# Patient Record
Sex: Female | Born: 1955 | Race: White | Hispanic: No | Marital: Married | Smoking: Never smoker
Health system: Southern US, Community
[De-identification: ages and names within clinical notes are randomized; demographics above are authoritative.]

## PROBLEM LIST (undated history)

## (undated) DIAGNOSIS — M7070 Other bursitis of hip, unspecified hip: Secondary | ICD-10-CM

## (undated) DIAGNOSIS — B019 Varicella without complication: Secondary | ICD-10-CM

## (undated) DIAGNOSIS — N390 Urinary tract infection, site not specified: Secondary | ICD-10-CM

## (undated) DIAGNOSIS — M1990 Unspecified osteoarthritis, unspecified site: Secondary | ICD-10-CM

## (undated) DIAGNOSIS — I471 Supraventricular tachycardia, unspecified: Secondary | ICD-10-CM

## (undated) DIAGNOSIS — G43909 Migraine, unspecified, not intractable, without status migrainosus: Secondary | ICD-10-CM

## (undated) DIAGNOSIS — F419 Anxiety disorder, unspecified: Secondary | ICD-10-CM

## (undated) HISTORY — DX: Supraventricular tachycardia, unspecified: I47.10

## (undated) HISTORY — DX: Migraine, unspecified, not intractable, without status migrainosus: G43.909

## (undated) HISTORY — DX: Urinary tract infection, site not specified: N39.0

## (undated) HISTORY — DX: Anxiety disorder, unspecified: F41.9

## (undated) HISTORY — DX: Other bursitis of hip, unspecified hip: M70.70

## (undated) HISTORY — PX: JOINT REPLACEMENT: SHX530

## (undated) HISTORY — DX: Varicella without complication: B01.9

## (undated) HISTORY — PX: TUBAL LIGATION: SHX77

## (undated) HISTORY — DX: Supraventricular tachycardia: I47.1

## (undated) HISTORY — DX: Unspecified osteoarthritis, unspecified site: M19.90

## (undated) HISTORY — PX: ABDOMINAL HYSTERECTOMY: SHX81

## (undated) HISTORY — PX: BREAST SURGERY: SHX581

---

## 1998-09-28 ENCOUNTER — Inpatient Hospital Stay (HOSPITAL_COMMUNITY): Admission: RE | Admit: 1998-09-28 | Discharge: 1998-10-01 | Payer: Self-pay | Admitting: Obstetrics and Gynecology

## 1999-05-04 ENCOUNTER — Emergency Department (HOSPITAL_COMMUNITY): Admission: EM | Admit: 1999-05-04 | Discharge: 1999-05-04 | Payer: Self-pay | Admitting: Emergency Medicine

## 1999-07-12 ENCOUNTER — Encounter: Payer: Self-pay | Admitting: Internal Medicine

## 1999-07-12 ENCOUNTER — Emergency Department (HOSPITAL_COMMUNITY): Admission: EM | Admit: 1999-07-12 | Discharge: 1999-07-12 | Payer: Self-pay | Admitting: Internal Medicine

## 2002-09-03 ENCOUNTER — Encounter: Admission: RE | Admit: 2002-09-03 | Discharge: 2002-09-03 | Payer: Self-pay | Admitting: *Deleted

## 2002-09-03 ENCOUNTER — Encounter: Payer: Self-pay | Admitting: Obstetrics and Gynecology

## 2002-09-29 ENCOUNTER — Encounter: Payer: Self-pay | Admitting: Obstetrics and Gynecology

## 2002-09-29 ENCOUNTER — Encounter: Admission: RE | Admit: 2002-09-29 | Discharge: 2002-09-29 | Payer: Self-pay | Admitting: Obstetrics and Gynecology

## 2004-01-04 ENCOUNTER — Emergency Department (HOSPITAL_COMMUNITY): Admission: EM | Admit: 2004-01-04 | Discharge: 2004-01-05 | Payer: Self-pay | Admitting: Emergency Medicine

## 2004-09-05 ENCOUNTER — Ambulatory Visit: Payer: Self-pay | Admitting: Internal Medicine

## 2004-09-06 ENCOUNTER — Ambulatory Visit: Payer: Self-pay | Admitting: Internal Medicine

## 2005-08-28 ENCOUNTER — Ambulatory Visit: Payer: Self-pay | Admitting: Internal Medicine

## 2006-08-27 ENCOUNTER — Ambulatory Visit: Payer: Self-pay | Admitting: Internal Medicine

## 2007-06-10 ENCOUNTER — Telehealth: Payer: Self-pay | Admitting: Internal Medicine

## 2007-07-01 ENCOUNTER — Telehealth (INDEPENDENT_AMBULATORY_CARE_PROVIDER_SITE_OTHER): Payer: Self-pay | Admitting: *Deleted

## 2007-10-23 ENCOUNTER — Telehealth (INDEPENDENT_AMBULATORY_CARE_PROVIDER_SITE_OTHER): Payer: Self-pay | Admitting: *Deleted

## 2007-10-28 ENCOUNTER — Telehealth (INDEPENDENT_AMBULATORY_CARE_PROVIDER_SITE_OTHER): Payer: Self-pay | Admitting: *Deleted

## 2008-03-04 ENCOUNTER — Telehealth (INDEPENDENT_AMBULATORY_CARE_PROVIDER_SITE_OTHER): Payer: Self-pay | Admitting: *Deleted

## 2008-03-18 ENCOUNTER — Telehealth (INDEPENDENT_AMBULATORY_CARE_PROVIDER_SITE_OTHER): Payer: Self-pay | Admitting: *Deleted

## 2008-03-23 ENCOUNTER — Ambulatory Visit: Payer: Self-pay | Admitting: Family Medicine

## 2008-03-23 ENCOUNTER — Encounter (INDEPENDENT_AMBULATORY_CARE_PROVIDER_SITE_OTHER): Payer: Self-pay | Admitting: *Deleted

## 2008-03-23 ENCOUNTER — Encounter: Payer: Self-pay | Admitting: Internal Medicine

## 2008-03-23 DIAGNOSIS — R519 Headache, unspecified: Secondary | ICD-10-CM | POA: Insufficient documentation

## 2008-03-23 DIAGNOSIS — R51 Headache: Secondary | ICD-10-CM | POA: Insufficient documentation

## 2008-03-23 DIAGNOSIS — N959 Unspecified menopausal and perimenopausal disorder: Secondary | ICD-10-CM | POA: Insufficient documentation

## 2008-03-23 DIAGNOSIS — Z8679 Personal history of other diseases of the circulatory system: Secondary | ICD-10-CM | POA: Insufficient documentation

## 2008-03-24 ENCOUNTER — Encounter (INDEPENDENT_AMBULATORY_CARE_PROVIDER_SITE_OTHER): Payer: Self-pay | Admitting: *Deleted

## 2008-10-30 ENCOUNTER — Telehealth: Payer: Self-pay | Admitting: Family Medicine

## 2009-04-05 ENCOUNTER — Telehealth (INDEPENDENT_AMBULATORY_CARE_PROVIDER_SITE_OTHER): Payer: Self-pay | Admitting: *Deleted

## 2009-04-14 ENCOUNTER — Ambulatory Visit: Payer: Self-pay | Admitting: Internal Medicine

## 2010-10-17 ENCOUNTER — Other Ambulatory Visit: Payer: Self-pay | Admitting: Internal Medicine

## 2010-10-23 ENCOUNTER — Other Ambulatory Visit: Payer: Self-pay | Admitting: Internal Medicine

## 2010-10-23 NOTE — Telephone Encounter (Signed)
Dr.Hopper please advise, last OV 04/14/2009

## 2010-10-23 NOTE — Telephone Encounter (Signed)
Unfortunately she has not been seen for 18 months. She would need an acute visit to allow refill of this medicines as required by the standard of care of the [facility]  medical board.

## 2010-10-24 NOTE — Telephone Encounter (Signed)
Called patient to inform her OV due. No answer and no answering service to leave message on VM. I will send message to pharmacy informing them patient needs to schedule OV

## 2010-10-30 ENCOUNTER — Encounter: Payer: Self-pay | Admitting: Internal Medicine

## 2010-10-30 DIAGNOSIS — Z0289 Encounter for other administrative examinations: Secondary | ICD-10-CM

## 2010-10-31 NOTE — Progress Notes (Signed)
  Subjective:    Patient ID: Katherine Sexton, female    DOB: 08-12-1955, 55 y.o.   MRN: [id number]             No Show for CPX HPI    Review of Systems     Objective:   Physical Exam        Assessment & Plan:      This encounter was created in error - please disregard.

## 2011-10-07 ENCOUNTER — Ambulatory Visit (INDEPENDENT_AMBULATORY_CARE_PROVIDER_SITE_OTHER): Payer: BC Managed Care – PPO | Admitting: Family Medicine

## 2011-10-07 VITALS — BP 119/88 | HR 98 | Temp 97.8°F | Resp 16 | Ht 69.0 in | Wt 154.0 lb

## 2011-10-07 DIAGNOSIS — J029 Acute pharyngitis, unspecified: Secondary | ICD-10-CM

## 2011-10-07 DIAGNOSIS — J069 Acute upper respiratory infection, unspecified: Secondary | ICD-10-CM

## 2011-10-07 LAB — POCT RAPID STREP A (OFFICE): Rapid Strep A Screen: NEGATIVE

## 2011-10-07 MED ORDER — AZITHROMYCIN 250 MG PO TABS: ORAL_TABLET | ORAL | Status: AC

## 2011-10-07 MED ORDER — HYDROCODONE-HOMATROPINE 5-1.5 MG/5ML PO SYRP: 5.0000 mL | ORAL_SOLUTION | Freq: Three times a day (TID) | ORAL | Status: AC | PRN

## 2011-10-07 NOTE — Progress Notes (Signed)
This 56 year old woman with 5 days of progressive cough, sore throat and nasal congestion. The sore throat became much more intense last night. The cough is productive of light brown sputum. She's not had a fever.  Objective: Throat is bright red the posterior pharynx, TMs are normal, chest is clear but she has a very congested cough. Neck is supple with mild anterior cervical adenopathy. Skin is warm and dry. Results for orders placed in visit on 10/07/11  POCT RAPID STREP A (OFFICE)      Component Value Range   Rapid Strep A Screen Negative  Negative    Assessment: Progressive URI  Plan: Z-Pak and Hydromet

## 2012-01-14 HISTORY — PX: BREAST ENHANCEMENT SURGERY: SHX7

## 2012-02-27 ENCOUNTER — Ambulatory Visit (INDEPENDENT_AMBULATORY_CARE_PROVIDER_SITE_OTHER): Payer: BC Managed Care – PPO | Admitting: Family Medicine

## 2012-02-27 VITALS — BP 118/84 | HR 84 | Temp 97.4°F | Resp 16 | Ht 67.75 in | Wt 150.0 lb

## 2012-02-27 DIAGNOSIS — N3090 Cystitis, unspecified without hematuria: Secondary | ICD-10-CM

## 2012-02-27 DIAGNOSIS — R319 Hematuria, unspecified: Secondary | ICD-10-CM

## 2012-02-27 DIAGNOSIS — N3091 Cystitis, unspecified with hematuria: Secondary | ICD-10-CM

## 2012-02-27 LAB — POCT UA - MICROSCOPIC ONLY
Casts, Ur, LPF, POC: NEGATIVE
Mucus, UA: NEGATIVE

## 2012-02-27 LAB — POCT URINALYSIS DIPSTICK
Glucose, UA: NEGATIVE
Spec Grav, UA: >=1.03
Urobilinogen, UA: 0.2

## 2012-02-27 MED ORDER — PHENAZOPYRIDINE HCL 100 MG PO TABS: 100.0000 mg | ORAL_TABLET | Freq: Three times a day (TID) | ORAL | Status: AC | PRN

## 2012-02-27 MED ORDER — CIPROFLOXACIN HCL 250 MG PO TABS: 250.0000 mg | ORAL_TABLET | Freq: Two times a day (BID) | ORAL | Status: AC

## 2012-02-27 NOTE — Progress Notes (Signed)
Subjective:    Patient ID: Katherine Sexton, female    DOB: 01-20-1956, 56 y.o.   MRN: [id number]  HPI Katherine Sexton is a 56 y.o. female Started with urgency, dysuria yesterday - hematuria noted this morning.   Last uti - 10 years ago.   No back pain/N/V.  Fatigued, but has been driving back and forth to [place] - mom had stroke recently.  Tx: none.    Review of Systems  Constitutional: Negative for fever.  Genitourinary: Positive for dysuria, urgency, frequency and hematuria.  Musculoskeletal: Negative for back pain.   No f/c, n/v or back pain.     Objective:   Physical Exam  Constitutional: She is oriented to person, place, and time. She appears well-developed and well-nourished.  HENT:  Head: Normocephalic and atraumatic.  Pulmonary/Chest: Effort normal.  Abdominal: Soft. Normal appearance. She exhibits no distension. There is no tenderness. There is no rebound, no guarding and no CVA tenderness.  Neurological: She is alert and oriented to person, place, and time.  Skin: Skin is warm and dry.  Psychiatric: She has a normal mood and affect. Her behavior is normal.      Results for orders placed in visit on 02/27/12  POCT UA - MICROSCOPIC ONLY      Component Value Range   WBC, Ur, HPF, POC 10-20     RBC, urine, microscopic tntc     Bacteria, U Microscopic 3+     Mucus, UA neg     Epithelial cells, urine per micros 0-2     Crystals, Ur, HPF, POC neg     Casts, Ur, LPF, POC neg     Yeast, UA neg    POCT URINALYSIS DIPSTICK      Component Value Range   Color, UA brown     Clarity, UA cloudy     Glucose, UA neg     Bilirubin, UA small     Ketones, UA trace     Spec Grav, UA >=1.030     Blood, UA large     pH, UA 6.0     Protein, UA 100 mg/dL     Urobilinogen, UA 0.2     Nitrite, UA positive     Leukocytes, UA small (1+)         Assessment & Plan:  Katherine Sexton is a 56 y.o. female 1. Hematuria  POCT UA - Microscopic Only, POCT urinalysis dipstick  2.  Hemorrhagic cystitis  ciprofloxacin (CIPRO) 250 MG tablet, phenazopyridine (PYRIDIUM) 100 MG tablet, Urine culture   Push fluids - 5 day course of cipro for hemorrhagic cystitis.  rtc precautions.   Patient Instructions  Your should receive a call or letter about your lab results within the next week to 10 days.  Return to the clinic or go to the nearest emergency room if any of your symptoms worsen or new symptoms occur. If the blood in the urine is not improving in the next few days as the infection resolves - return to clinic or your primary care provider to have this rechecked.    Urinary Tract Infection Infections of the urinary tract can start in several places. A bladder infection (cystitis), a kidney infection (pyelonephritis), and a prostate infection (prostatitis) are different types of urinary tract infections (UTIs). They usually get better if treated with medicines (antibiotics) that kill germs. Take all the medicine until it is gone. You or your child may feel better in a few days, but TAKE ALL  MEDICINE or the infection may not respond and may become more difficult to treat. HOME CARE INSTRUCTIONS   Drink enough water and fluids to keep the urine clear or pale yellow. Cranberry juice is especially recommended, in addition to large amounts of water.   Avoid caffeine, tea, and carbonated beverages. They tend to irritate the bladder.   Alcohol may irritate the prostate.   Only take over-the-counter or prescription medicines for pain, discomfort, or fever as directed by your caregiver.  To prevent further infections:  Empty the bladder often. Avoid holding urine for long periods of time.   After a bowel movement, women should cleanse from front to back. Use each tissue only once.   Empty the bladder before and after sexual intercourse.  FINDING OUT THE RESULTS OF YOUR TEST Not all test results are available during your visit. If your or your child's test results are not back  during the visit, make an appointment with your caregiver to find out the results. Do not assume everything is normal if you have not heard from your caregiver or the medical facility. It is important for you to follow up on all test results. SEEK MEDICAL CARE IF:   There is back pain.   Your baby is older than 3 months with a rectal temperature of 100.5 F (38.1 C) or higher for more than 1 day.   Your or your child's problems (symptoms) are no better in 3 days. Return sooner if you or your child is getting worse.  SEEK IMMEDIATE MEDICAL CARE IF:   There is severe back pain or lower abdominal pain.   You or your child develops chills.   You have a fever.   Your baby is older than 3 months with a rectal temperature of 102 F (38.9 C) or higher.   Your baby is 41 months old or younger with a rectal temperature of 100.4 F (38 C) or higher.   There is nausea or vomiting.   There is continued burning or discomfort with urination.  MAKE SURE YOU:   Understand these instructions.   Will watch your condition.   Will get help right away if you are not doing well or get worse.  Document Released: 04/11/2005 Document Revised: 06/21/2011 Document Reviewed: 11/14/2006 [facility] Patient Information 2012 [place], [place].

## 2012-02-27 NOTE — Patient Instructions (Signed)
Your should receive a call or letter about your lab results within the next week to 10 days.  Return to the clinic or go to the nearest emergency room if any of your symptoms worsen or new symptoms occur. If the blood in the urine is not improving in the next few days as the infection resolves - return to clinic or your primary care provider to have this rechecked.    Urinary Tract Infection Infections of the urinary tract can start in several places. A bladder infection (cystitis), a kidney infection (pyelonephritis), and a prostate infection (prostatitis) are different types of urinary tract infections (UTIs). They usually get better if treated with medicines (antibiotics) that kill germs. Take all the medicine until it is gone. You or your child may feel better in a few days, but TAKE ALL MEDICINE or the infection may not respond and may become more difficult to treat. HOME CARE INSTRUCTIONS   Drink enough water and fluids to keep the urine clear or pale yellow. Cranberry juice is especially recommended, in addition to large amounts of water.   Avoid caffeine, tea, and carbonated beverages. They tend to irritate the bladder.   Alcohol may irritate the prostate.   Only take over-the-counter or prescription medicines for pain, discomfort, or fever as directed by your caregiver.  To prevent further infections:  Empty the bladder often. Avoid holding urine for long periods of time.   After a bowel movement, women should cleanse from front to back. Use each tissue only once.   Empty the bladder before and after sexual intercourse.  FINDING OUT THE RESULTS OF YOUR TEST Not all test results are available during your visit. If your or your child's test results are not back during the visit, make an appointment with your caregiver to find out the results. Do not assume everything is normal if you have not heard from your caregiver or the medical facility. It is important for you to follow up on all  test results. SEEK MEDICAL CARE IF:   There is back pain.   Your baby is older than 3 months with a rectal temperature of 100.5 F (38.1 C) or higher for more than 1 day.   Your or your child's problems (symptoms) are no better in 3 days. Return sooner if you or your child is getting worse.  SEEK IMMEDIATE MEDICAL CARE IF:   There is severe back pain or lower abdominal pain.   You or your child develops chills.   You have a fever.   Your baby is older than 3 months with a rectal temperature of 102 F (38.9 C) or higher.   Your baby is 27 months old or younger with a rectal temperature of 100.4 F (38 C) or higher.   There is nausea or vomiting.   There is continued burning or discomfort with urination.  MAKE SURE YOU:   Understand these instructions.   Will watch your condition.   Will get help right away if you are not doing well or get worse.  Document Released: 04/11/2005 Document Revised: 06/21/2011 Document Reviewed: 11/14/2006 [facility] Patient Information 2012 [place], [place].

## 2012-03-01 LAB — URINE CULTURE: Colony Count: >=100000

## 2012-05-03 ENCOUNTER — Encounter (HOSPITAL_COMMUNITY): Payer: Self-pay | Admitting: *Deleted

## 2012-05-03 ENCOUNTER — Emergency Department (HOSPITAL_COMMUNITY)
Admission: EM | Admit: 2012-05-03 | Discharge: 2012-05-03 | Disposition: A | Discharge status: Home / Self Care | Payer: BC Managed Care – PPO | Attending: Emergency Medicine | Admitting: Emergency Medicine

## 2012-05-03 DIAGNOSIS — R4182 Altered mental status, unspecified: Secondary | ICD-10-CM | POA: Insufficient documentation

## 2012-05-03 DIAGNOSIS — Z79899 Other long term (current) drug therapy: Secondary | ICD-10-CM | POA: Insufficient documentation

## 2012-05-03 DIAGNOSIS — Z888 Allergy status to other drugs, medicaments and biological substances status: Secondary | ICD-10-CM | POA: Insufficient documentation

## 2012-05-03 DIAGNOSIS — G43909 Migraine, unspecified, not intractable, without status migrainosus: Secondary | ICD-10-CM | POA: Insufficient documentation
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
## 2012-05-03 NOTE — ED Notes (Signed)
Pt comes from home with emotional distress related to her son and grandchild.  Pt's son is an alcoholic who recently relapsed and is now unable to care for his family.  Pt and her husband are financially supporting son and his family and pt feels overwhelmed with concern for her son and grandson.  Pt states she and daughter went to have drinks and pedicures this evening.  Pt states she became upset thinking about her son/grandson and took a shower to calm down.  Pt's daughter states when pt got out of shower she was still upset and began to cry inconsolably.  Pt then became unresponsive for @ 30 seconds.  Pt denies SI/HI and denies being afraid of her husband.  Pt is not seeking psychiatric care and states she wants to go home.  Pt's daughter, husband and friend are currently at bedside.

## 2012-05-03 NOTE — ED Notes (Signed)
[facility] team personnel with patient at this time

## 2012-05-03 NOTE — ED Provider Notes (Signed)
History     CSN: [id number]  Arrival date & time 05/03/12  4098   First MD Initiated Contact with Patient 05/03/12 1953      Chief Complaint  Patient presents with  . Panic Attack    (Consider location/radiation/quality/duration/timing/severity/associated sxs/prior treatment) The history is provided by the patient.   patient presents with altered mental status. She's was reportedly unresponsive.  she had 2 drinks tonight is earlier in the evening. She has had stress over her family members. She reportedly went and had what was told by her husband but panic attack and then became unresponsive. No chest pain no numbness or weakness. She still appears slightly slowed. Patient states she's been up since 3:45 in the morning. Patient does not want further workup for the altered mental status or syncope.   Past Medical History  Diagnosis Date  . Migraines   . PSVT (paroxysmal supraventricular tachycardia)     Past Surgical History  Procedure Date  . Abdominal hysterectomy     for prolapse  . Tubal ligation     Family History  Problem Relation Age of Onset  . Hypertension Mother   . Osteoporosis Mother   . Hypertension Father   . Heart disease Father     CABG  . Cancer Father     renal cancer and prostate cancer    History  Substance Use Topics  . Smoking status: Never Smoker   . Smokeless tobacco: Never Used  . Alcohol Use: Yes    OB History    Grav Para Term Preterm Abortions TAB SAB Ect Mult Living                  Review of Systems  Constitutional: Negative for activity change and appetite change.  HENT: Negative for neck stiffness.   Eyes: Negative for pain.  Respiratory: Negative for chest tightness and shortness of breath.   Cardiovascular: Negative for chest pain and leg swelling.  Gastrointestinal: Negative for nausea, vomiting, abdominal pain and diarrhea.  Genitourinary: Negative for flank pain.  Musculoskeletal: Negative for back pain.  Skin:  Negative for rash.  Neurological: Positive for syncope. Negative for weakness, numbness and headaches.  Psychiatric/Behavioral: Positive for confusion. Negative for behavioral problems.    Allergies  Sumatriptan  Home Medications   Current Outpatient Rx  Name Route Sig Dispense Refill  . ADDERALL PO Oral Take 1 tablet by mouth daily.    Marland Kitchen METOPROLOL SUCCINATE ER 50 MG PO TB24 Oral Take 50 mg by mouth daily. Take with or immediately following a meal.    . IMITREX PO Oral Take 1 tablet by mouth as needed. migraine      BP 122/64  Pulse 62  Temp 97.5 F (36.4 C) (Oral)  Resp 18  SpO2 96%  Physical Exam  Nursing note and vitals reviewed. Constitutional: She is oriented to person, place, and time. She appears well-developed and well-nourished.  HENT:  Head: Normocephalic and atraumatic.  Eyes: EOM are normal. Pupils are equal, round, and reactive to light.  Neck: Normal range of motion. Neck supple.  Cardiovascular: Normal rate, regular rhythm and normal heart sounds.   No murmur heard. Pulmonary/Chest: Effort normal and breath sounds normal. No respiratory distress. She has no wheezes. She has no rales.  Abdominal: Soft. Bowel sounds are normal. She exhibits no distension. There is no tenderness. There is no rebound and no guarding.  Musculoskeletal: Normal range of motion.  Neurological: She is alert and oriented to person, place, and  time. No cranial nerve deficit.  Skin: Skin is warm and dry.  Psychiatric: Her speech is normal.       Somewhat slowed. She's awake and answer questions. She denies suicidal or homicidal thoughts.    ED Course  Procedures (including critical care time)  Labs Reviewed - No data to display No results found.   1. Altered mental status       MDM   patient had an unresponsive episode at home. Patient thinks it may have been anxiety. She drank 2 drinks, but I doubt this is intoxication. Patient does not want any workup. She is appropriate  and appears to have the capacity to refuse treatment. She'll be discharged home and was given behavioral health followup as needed.         Juliet Rude. Rubin Payor, MD 05/03/12 2040

## 2012-05-03 NOTE — [facility] Counselor (Signed)
Writer gave patient written referrals for outpt services including psychiatry and therapy. Pt's husband sts that they will get a referral from a counselor who is a family friend. Pt has no SI or HI.

## 2012-05-03 NOTE — ED Notes (Signed)
BMW:[id number]<GM> Expected date:05/03/12<BR> Expected time: 6:24 PM<BR> Means of arrival:Ambulance<BR> Comments:<BR> Unresponsive x 10 min, now wnl

## 2013-05-05 ENCOUNTER — Ambulatory Visit: Payer: BC Managed Care – PPO | Admitting: Internal Medicine

## 2013-05-05 VITALS — BP 104/72 | HR 82 | Temp 97.9°F | Resp 18 | Ht 68.0 in | Wt 158.6 lb

## 2013-05-05 DIAGNOSIS — R451 Restlessness and agitation: Secondary | ICD-10-CM

## 2013-05-05 DIAGNOSIS — IMO0002 Reserved for concepts with insufficient information to code with codable children: Secondary | ICD-10-CM

## 2013-05-05 DIAGNOSIS — L255 Unspecified contact dermatitis due to plants, except food: Secondary | ICD-10-CM

## 2013-05-05 DIAGNOSIS — J209 Acute bronchitis, unspecified: Secondary | ICD-10-CM

## 2013-05-05 DIAGNOSIS — L237 Allergic contact dermatitis due to plants, except food: Secondary | ICD-10-CM

## 2013-05-05 MED ORDER — ALPRAZOLAM 1 MG PO TABS: ORAL_TABLET | ORAL | Status: DC

## 2013-05-05 MED ORDER — HYDROCODONE-ACETAMINOPHEN 7.5-325 MG/15ML PO SOLN: 5.0000 mL | Freq: Four times a day (QID) | ORAL | Status: DC | PRN

## 2013-05-05 MED ORDER — METHYLPREDNISOLONE ACETATE 80 MG/ML IJ SUSP
80.0000 mg | Freq: Once | INTRAMUSCULAR | Status: AC
  Administered 2013-05-05: 80 mg via INTRAMUSCULAR

## 2013-05-05 MED ORDER — PREDNISONE 20 MG PO TABS: ORAL_TABLET | ORAL | Status: DC

## 2013-05-05 MED ORDER — AZITHROMYCIN 250 MG PO TABS: ORAL_TABLET | ORAL | Status: DC

## 2013-05-05 NOTE — Patient Instructions (Signed)
Bronchitis Bronchitis is the body's way of reacting to injury and/or infection (inflammation) of the bronchi. Bronchi are the air tubes that extend from the windpipe into the lungs. If the inflammation becomes severe, it may cause shortness of breath. CAUSES  Inflammation may be caused by:  A virus.  Germs (bacteria).  Dust.  Allergens.  Pollutants and many other irritants. The cells lining the bronchial tree are covered with tiny hairs (cilia). These constantly beat upward, away from the lungs, toward the mouth. This keeps the lungs free of pollutants. When these cells become too irritated and are unable to do their job, mucus begins to develop. This causes the characteristic cough of bronchitis. The cough clears the lungs when the cilia are unable to do their job. Without either of these protective mechanisms, the mucus would settle in the lungs. Then you would develop pneumonia. Smoking is a common cause of bronchitis and can contribute to pneumonia. Stopping this habit is the single most important thing you can do to help yourself. TREATMENT   Your caregiver may prescribe an antibiotic if the cough is caused by bacteria. Also, medicines that open up your airways make it easier to breathe. Your caregiver may also recommend or prescribe an expectorant. It will loosen the mucus to be coughed up. Only take over-the-counter or prescription medicines for pain, discomfort, or fever as directed by your caregiver.  Removing whatever causes the problem (smoking, for example) is critical to preventing the problem from getting worse.  Cough suppressants may be prescribed for relief of cough symptoms.  Inhaled medicines may be prescribed to help with symptoms now and to help prevent problems from returning.  For those with recurrent (chronic) bronchitis, there may be a need for steroid medicines. SEEK IMMEDIATE MEDICAL CARE IF:   During treatment, you develop more pus-like mucus (purulent  sputum).  You have a fever.  Your baby is older than 3 months with a rectal temperature of 102 F (38.9 C) or higher.  Your baby is 44 months old or younger with a rectal temperature of 100.4 F (38 C) or higher.  You become progressively more ill.  You have increased difficulty breathing, wheezing, or shortness of breath. It is necessary to seek immediate medical care if you are elderly or sick from any other disease. MAKE SURE YOU:   Understand these instructions.  Will watch your condition.  Will get help right away if you are not doing well or get worse. Document Released: 07/02/2005 Document Revised: 09/24/2011 Document Reviewed: 05/11/2008 [facility] Patient Information 2014 [place], [place]. Poison [employer] ivy is a inflammation of the skin (contact dermatitis) caused by touching the allergens on the leaves of the ivy plant following previous exposure to the plant. The rash usually appears 48 hours after exposure. The rash is usually bumps (papules) or blisters (vesicles) in a linear pattern. Depending on your own sensitivity, the rash may simply cause redness and itching, or it may also progress to blisters which may break open. These must be well cared for to prevent secondary bacterial (germ) infection, followed by scarring. Keep any open areas dry, clean, dressed, and covered with an antibacterial ointment if needed. The eyes may also get puffy. The puffiness is worst in the morning and gets better as the day progresses. This dermatitis usually heals without scarring, within 2 to 3 weeks without treatment. HOME CARE INSTRUCTIONS  Thoroughly wash with soap and water as soon as you have been exposed to poison ivy. You have about  one half hour to remove the plant resin before it will cause the rash. This washing will destroy the oil or antigen on the skin that is causing, or will cause, the rash. Be sure to wash under your fingernails as any plant resin there will continue to spread  the rash. Do not rub skin vigorously when washing affected area. Poison ivy cannot spread if no oil from the plant remains on your body. A rash that has progressed to weeping sores will not spread the rash unless you have not washed thoroughly. It is also important to wash any clothes you have been wearing as these may carry active allergens. The rash will return if you wear the unwashed clothing, even several days later. Avoidance of the plant in the future is the best measure. Poison ivy plant can be recognized by the number of leaves. Generally, poison ivy has three leaves with flowering branches on a single stem. Diphenhydramine may be purchased over the counter and used as needed for itching. Do not drive with this medication if it makes you drowsy.Ask your caregiver about medication for children. SEEK MEDICAL CARE IF:  Open sores develop.  Redness spreads beyond area of rash.  You notice purulent (pus-like) discharge.  You have increased pain.  Other signs of infection develop (such as fever). Document Released: 06/29/2000 Document Revised: 09/24/2011 Document Reviewed: 05/18/2009 [facility] Patient Information 2014 [place], [place].

## 2013-05-05 NOTE — Progress Notes (Signed)
  Subjective:    Patient ID: Katherine Sexton, female    DOB: 1956-02-26, 57 y.o.   MRN: [id number]  Patient comes into our office with cold symptoms and a rash that all started within a week  Poison Ivy This is a new problem. The current episode started in the past 7 days. The problem has been gradually worsening since onset. The affected locations include the face, neck, chest, back, scalp, left arm, left hand, left wrist, left ear, right ear, right arm, right hand and right wrist. The rash is characterized by redness, itchiness, swelling, blistering, dryness and pain. She was exposed to plant contact. Past treatments include antihistamine and anti-itch cream. The treatment provided mild relief.  Sore Throat  Pertinent negatives include no ear pain or headaches.  Cough Associated symptoms include chills and postnasal drip. Pertinent negatives include no ear pain or headaches.      Review of Systems  Constitutional: Positive for chills.  HENT: Positive for postnasal drip. Negative for ear pain.   Gastrointestinal: Negative for nausea.  Neurological: Negative for headaches.       Objective:   Physical Exam  Vitals reviewed. Constitutional: She appears well-developed and well-nourished. She appears distressed.  HENT:  Right Ear: External ear normal.  Left Ear: External ear normal.  Mouth/Throat: Oropharynx is clear and moist.  Eyes: EOM are normal. Pupils are equal, round, and reactive to light.  Neck: Normal range of motion. Neck supple.  Cardiovascular: Normal rate.   Pulmonary/Chest: Effort normal. Not tachypneic. She has rhonchi.  Abdominal: Soft. Bowel sounds are normal.  Skin: Rash noted. Rash is papular, maculopapular and vesicular. There is erythema.     Linear blebs, classic PI  Psychiatric: Her speech is normal. Judgment and thought content normal. Her mood appears anxious. She is agitated. Cognition and memory are normal.          Assessment & Plan:  Severe  Poison ovy/bronchitis Agitation/Pruitis Depomedrol/Prednisone taper Zpak/Lortab elixir

## 2013-07-16 LAB — HM MAMMOGRAPHY

## 2013-08-28 ENCOUNTER — Ambulatory Visit: Payer: BC Managed Care – PPO | Admitting: Family Medicine

## 2013-08-28 VITALS — BP 132/82 | HR 98 | Temp 98.0°F | Resp 17 | Ht 68.0 in | Wt 160.0 lb

## 2013-08-28 DIAGNOSIS — L255 Unspecified contact dermatitis due to plants, except food: Secondary | ICD-10-CM

## 2013-08-28 DIAGNOSIS — L299 Pruritus, unspecified: Secondary | ICD-10-CM

## 2013-08-28 MED ORDER — PREDNISONE 20 MG PO TABS: ORAL_TABLET | ORAL | Status: DC

## 2013-08-28 NOTE — Progress Notes (Signed)
[facility] [address] [place] [address] [phone]  Date:  08/28/2013   Name:  Katherine Sexton   DOB:  1955/09/27   MRN:  [id number]  PCP:  Marga Melnick, MD    Chief Complaint: Poison Ivy   History of Present Illness:  Katherine Sexton is a 58 y.o. very pleasant female patient who presents with the following:  She is here today with possible poison ivy dermatitis.  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  In October she got into PI and had quite a severe reaction.  She was tx with depo- medrol and prednisone PO. This treatment did help her.   Today is Friday. Last saturday when the weather was nice they were out working on the farm and she picked up up some sticks. She was wearing long sleeves and gloves most of the time.  She started to break out in a rash the day after she did the work- it has gotten worse despite benadryl by mouth.  She is itchy and miserable.   She is generally a healthy person. No fever, no other sx of illness.   She had history of PVST in 1994- this has resolved and not come back.  Cause is unknown.    Patient Active Problem List   Diagnosis Date Noted  . PERIMENOPAUSAL SYNDROME 03/23/2008  . HEADACHE 03/23/2008  . SUPRAVENTRICULAR TACHYCARDIA, PAROXYSMAL, HX OF 03/23/2008    Past Medical History  Diagnosis Date  . Migraines   . PSVT (paroxysmal supraventricular tachycardia)     Past Surgical History  Procedure Laterality Date  . Abdominal hysterectomy      for prolapse  . Tubal ligation      History  Substance Use Topics  . Smoking status: Never Smoker   . Smokeless tobacco: Never Used  . Alcohol Use: Yes    Family History  Problem Relation Age of Onset  . Hypertension Mother   . Osteoporosis Mother   . Hypertension Father   . Heart disease Father     CABG  . Cancer Father     renal cancer and prostate cancer    Allergies  Allergen Reactions  . Sumatriptan     REACTION: tachycardia     Medication list has been reviewed and updated.  Current Outpatient Prescriptions on File Prior to Visit  Medication Sig Dispense Refill  . SUMAtriptan Succinate (IMITREX PO) Take 1 tablet by mouth as needed. migraine       No current facility-administered medications on file prior to visit.    Review of Systems:  As per HPI- otherwise negative.   Physical Examination: Filed Vitals:   08/28/13 0824  BP: 132/82  Pulse: 98  Temp: 98 F (36.7 C)  Resp: 17   Filed Vitals:   08/28/13 0824  Height: 5\' 8"  (1.727 m)  Weight: 160 lb (72.576 kg)   Body mass index is 24.33 kg/(m^2). Ideal Body Weight: Weight in (lb) to have BMI = 25: 164.1  GEN: WDWN, NAD, Non-toxic, A & O x 3, looks well but is scratching her skin HEENT: Atraumatic, Normocephalic. Neck supple. No masses, No LAD.  Bilateral TM wnl, oropharynx normal.  PEERL,EOMI.  Ears and Nose: No external deformity. CV: RRR, No M/G/R. No JVD. No thrill. No extra heart sounds. PULM: CTA B, no wheezes, crackles, rhonchi. No retractions. No resp. distress. No accessory muscle use. EXTR: No c/c/e NEURO Normal gait.  PSYCH: Normally  interactive. Conversant. Not depressed or anxious appearing.  Calm demeanor.  pruritic rash mostly on her arms (right more than left), chest and face.  She is scratching this rash quite a bit.  No vesicles, but is red and inflamed typical of rhus dermatitis.  No urticaria.     Assessment and Plan: Rhus dermatitis - Plan: predniSONE (DELTASONE) 20 MG tablet, Ambulatory referral to Allergy  Itching  Referral to allergist per her request Recurrent rhus dermatitis. Treat with PO prednisone and OTC medication as per pt inscriptions.   Asked her to let me know if not better soon- Sooner if worse.   Meds ordered this encounter  Medications  . predniSONE (DELTASONE) 20 MG tablet    Sig: Take 3 pills a day for 2 days, then 2 pills a day for 4 days, then 1 pill a day for 4 days    Dispense:  18 tablet     Refill:  0       Signed Abbe AmsterdamJessica Solymar Grace, MD

## 2013-08-28 NOTE — Patient Instructions (Signed)
Use the prednisone as directed.  Remember this will probably cause increased appetite and thirst- this is normal and will pass Also, continue to use benadryl up to 50 mg every 6 hours.  This can make you drowsy so be cautious regarding driving.  Max 300mg  a day Also, take one claritin or zyrtec daily, and also take an OTC zantac once a day for about 10 days.   Consider using an oatmeal bath, and you can use OTC benadryl creams.

## 2013-10-14 ENCOUNTER — Ambulatory Visit: Payer: BC Managed Care – PPO

## 2013-10-14 ENCOUNTER — Ambulatory Visit: Payer: BC Managed Care – PPO | Admitting: Emergency Medicine

## 2013-10-14 VITALS — BP 120/80 | HR 96 | Temp 98.5°F | Resp 18 | Ht 68.0 in | Wt 164.0 lb

## 2013-10-14 DIAGNOSIS — J029 Acute pharyngitis, unspecified: Secondary | ICD-10-CM

## 2013-10-14 DIAGNOSIS — R059 Cough, unspecified: Secondary | ICD-10-CM

## 2013-10-14 DIAGNOSIS — R05 Cough: Secondary | ICD-10-CM

## 2013-10-14 DIAGNOSIS — R52 Pain, unspecified: Secondary | ICD-10-CM

## 2013-10-14 DIAGNOSIS — J209 Acute bronchitis, unspecified: Secondary | ICD-10-CM

## 2013-10-14 LAB — POCT CBC
Granulocyte percent: 68.5 %G (ref 37–80)
HCT, POC: 39.9 % (ref 37.7–47.9)
Hemoglobin: 12.4 g/dL (ref 12.2–16.2)
Lymph, poc: 1.4 (ref 0.6–3.4)
MCH, POC: 30.9 pg (ref 27–31.2)
MCHC: 31.1 g/dL — AB (ref 31.8–35.4)
MCV: 99.5 fL — AB (ref 80–97)
MID (cbc): 0.8 (ref 0–0.9)
MPV: 7.7 fL (ref 0–99.8)
PLATELET COUNT, POC: 410 10*3/uL (ref 142–424)
POC Granulocyte: 4.8 (ref 2–6.9)
POC LYMPH %: 19.8 % (ref 10–50)
POC MID %: 11.7 % (ref 0–12)
RBC: 4.01 M/uL — AB (ref 4.04–5.48)
RDW, POC: 15.3 %
WBC: 7 10*3/uL (ref 4.6–10.2)

## 2013-10-14 LAB — POCT RAPID STREP A (OFFICE): Rapid Strep A Screen: NEGATIVE

## 2013-10-14 LAB — POCT INFLUENZA A/B
Influenza A, POC: NEGATIVE
Influenza B, POC: NEGATIVE

## 2013-10-14 MED ORDER — AZITHROMYCIN 250 MG PO TABS: ORAL_TABLET | ORAL | Status: DC

## 2013-10-14 MED ORDER — HYDROCODONE-HOMATROPINE 5-1.5 MG/5ML PO SYRP: 5.0000 mL | ORAL_SOLUTION | Freq: Three times a day (TID) | ORAL | Status: DC | PRN

## 2013-10-14 MED ORDER — BENZONATATE 100 MG PO CAPS: 100.0000 mg | ORAL_CAPSULE | Freq: Three times a day (TID) | ORAL | Status: DC | PRN

## 2013-10-14 NOTE — Patient Instructions (Signed)

## 2013-10-14 NOTE — Progress Notes (Addendum)
Subjective:    Patient ID: Katherine Sexton, female    DOB: [birth year], 58 y.o.   MRN: [id number] This chart was scribed for Katherine GobbleSteven A Daryon Remmert, MD by Valera CastleSteven Perry, ED Scribe. This patient was seen in room 01 and the patient's care was started at 9:52 AM.  Chief Complaint  Patient presents with  . Cough    x 5 days  . Sore Throat    fever 101 yesterday  . Generalized Body Aches   HPI Katherine DunMary C Sexton is a 58 y.o. female Pt presents with gradually worsening, generalized body aches, onset 5 days ago, with associated intermittent cough, mild chest congestion, and sore throat. She states the sore throat is mostly likely due to the persistent coughing. She also reports an associated fever yesterday, with a max  temperature of 101 at home. She reports traveling to visit relatives and has been to the beach, but denies being around any sick contacts. She denies having flu immunization this year. She denies h/o tonsillectomy. She denies wheezing and any other associated symptoms.   PCP - Marga MelnickWilliam Hopper, MD  Patient Active Problem List   Diagnosis Date Noted  . PERIMENOPAUSAL SYNDROME 03/23/2008  . HEADACHE 03/23/2008  . SUPRAVENTRICULAR TACHYCARDIA, PAROXYSMAL, HX OF 03/23/2008   Prior to Admission medications   Medication Sig Start Date End Date Taking? Authorizing Provider  SUMAtriptan Succinate (IMITREX PO) Take 1 tablet by mouth as needed. migraine   Yes Historical Provider, MD   Review of Systems  Constitutional: Positive for fever (max temperature of 101).  HENT: Positive for congestion (mild chest congestion) and sore throat.   Respiratory: Positive for cough. Negative for wheezing.   Musculoskeletal: Positive for myalgias (generalized body aches).   BP 120/80  Pulse 96  Temp(Src) 98.5 F (36.9 C) (Oral)  Resp 18  Ht 5\' 8"  (1.727 m)  Wt 164 lb (74.39 kg)  BMI 24.94 kg/m2  SpO2 99%     Objective:   Physical Exam Nursing note and vitals reviewed. Constitutional: Pt is oriented to  person, place, and time. Pt appears well-developed and well-nourished. No distress.  HENT: Right TM nl. Left TM nl. Oropharyngeal erythema, no exudate. Nose nl.  Head: Normocephalic and atraumatic.  Eyes: EOM are normal. Pupils are equal, round, and reactive to light.  Neck: Neck supple. No thyromegaly. No cervical adenopathy.  Cardiovascular: Normal rate, regular rhythm and normal heart sounds.  Exam reveals no gallop and no friction rub. No murmur heard. Pulmonary/Chest: Effort normal. Decreased breath sounds in the bases. No rales, wheezes. Abdominal: Soft. Bowel sounds are normal. There is no tenderness. There is no rebound and no guarding. No hepatosplenomegaly. No CVA tenderness.  Musculoskeletal: Normal range of motion. No tenderness. No edema.   Neurological: Pt is alert and oriented to person, place, and time.  Skin: Skin is warm and dry.  Psychiatric: Pt has a normal mood and affect. Pt's behavior is normal.   [facility] reading (PRIMARY) by Dr. Cleta Albertsaub there are breast implants present. There are mild increased markings in the right middle lobe.  Results for orders placed in visit on 10/14/13  POCT CBC      Result Value Ref Range   WBC 7.0  4.6 - 10.2 K/uL   Lymph, poc 1.4  0.6 - 3.4   POC LYMPH PERCENT 19.8  10 - 50 %L   MID (cbc) 0.8  0 - 0.9   POC MID % 11.7  0 - 12 %M   POC Granulocyte  4.8  2 - 6.9   Granulocyte percent 68.5  37 - 80 %G   RBC 4.01 (*) 4.04 - 5.48 M/uL   Hemoglobin 12.4  12.2 - 16.2 g/dL   HCT, POC 16.1  09.6 - 47.9 %   MCV 99.5 (*) 80 - 97 fL   MCH, POC 30.9  27 - 31.2 pg   MCHC 31.1 (*) 31.8 - 35.4 g/dL   RDW, POC 04.5     Platelet Count, POC 410  142 - 424 K/uL   MPV 7.7  0 - 99.8 fL  POCT INFLUENZA A/B      Result Value Ref Range   Influenza A, POC Negative     Influenza B, POC Negative    POCT RAPID STREP A (OFFICE)      Result Value Ref Range   Rapid Strep A Screen Negative  Negative       Assessment & Plan:   She has mild increased markings  in the right middle lobe. We'll treat with fluids rest Z-Pak and Tessalon Perles  And Hycodan for cough  I personally performed the services described in this documentation, which was scribed in my presence. The recorded information has been reviewed and is accurate.

## 2013-10-16 LAB — CULTURE, GROUP A STREP: ORGANISM ID, BACTERIA: NORMAL

## 2014-01-01 ENCOUNTER — Ambulatory Visit (INDEPENDENT_AMBULATORY_CARE_PROVIDER_SITE_OTHER): Payer: BC Managed Care – PPO

## 2014-01-01 ENCOUNTER — Ambulatory Visit (INDEPENDENT_AMBULATORY_CARE_PROVIDER_SITE_OTHER): Payer: BC Managed Care – PPO | Admitting: Family Medicine

## 2014-01-01 VITALS — BP 132/82 | HR 100 | Temp 97.8°F | Resp 16 | Ht 68.0 in | Wt 167.2 lb

## 2014-01-01 DIAGNOSIS — M79604 Pain in right leg: Secondary | ICD-10-CM

## 2014-01-01 DIAGNOSIS — M79609 Pain in unspecified limb: Secondary | ICD-10-CM

## 2014-01-01 DIAGNOSIS — S8010XA Contusion of unspecified lower leg, initial encounter: Secondary | ICD-10-CM

## 2014-01-01 DIAGNOSIS — S8011XA Contusion of right lower leg, initial encounter: Secondary | ICD-10-CM

## 2014-01-01 NOTE — Patient Instructions (Signed)
Continue regular activities. Take Tylenol or ibuprofen as needed for pain. Return if pain continues to get worse, but it appears just to be a bad contusion that will gradually resolve with time.

## 2014-01-01 NOTE — Progress Notes (Signed)
Subjective: Patient tripped when walking in high heels across retracts into a restaurant. She landed across the truck. She has pain and bruising of both shins, but the right primarily. This happened a week ago. She's been walking every day since. However the right side is been hurting more the last few days.  Objective: I was contusion of both shins, primarily in the right. There is ecchymosis and yellowish discoloration. The ecchymosis extends down below the left medial medial malleolus. The blood has tracked him down there. She is tender along the tibia. Minimal calf tenderness. Negative Homans sign. No edema of significance.  [facility] reading (PRIMARY) by  Dr. Alwyn RenHopper Mild soft tissue swelling in area of contusion 4- 5 inches below the knee, no bony abnormality.  Assessment: Contusion and legs, primarily right  Plan: Symptomatic treatment, ibuprofen,, return if needed

## 2014-06-14 ENCOUNTER — Ambulatory Visit (INDEPENDENT_AMBULATORY_CARE_PROVIDER_SITE_OTHER): Payer: BC Managed Care – PPO | Admitting: Physician Assistant

## 2014-06-14 VITALS — BP 122/78 | HR 86 | Temp 98.2°F | Resp 17 | Ht 68.0 in | Wt 165.0 lb

## 2014-06-14 DIAGNOSIS — J0100 Acute maxillary sinusitis, unspecified: Secondary | ICD-10-CM

## 2014-06-14 MED ORDER — GUAIFENESIN ER 1200 MG PO TB12: 1.0000 | ORAL_TABLET | Freq: Two times a day (BID) | ORAL | Status: DC | PRN

## 2014-06-14 MED ORDER — IPRATROPIUM BROMIDE 0.03 % NA SOLN: 2.0000 | Freq: Two times a day (BID) | NASAL | Status: DC

## 2014-06-14 MED ORDER — HYDROCOD POLST-CHLORPHEN POLST 10-8 MG/5ML PO LQCR: 5.0000 mL | Freq: Two times a day (BID) | ORAL | Status: DC | PRN

## 2014-06-14 MED ORDER — AMOXICILLIN-POT CLAVULANATE 875-125 MG PO TABS: 1.0000 | ORAL_TABLET | Freq: Two times a day (BID) | ORAL | Status: DC

## 2014-06-14 NOTE — Patient Instructions (Signed)
Get plenty of rest and drink at least 64 ounces of water daily. 

## 2014-06-14 NOTE — Progress Notes (Signed)
Subjective:    Patient ID: Katherine Sexton, female    DOB: [birth year], 58 y.o.   MRN: [id number]   PCP: Marga MelnickWilliam Hopper, MD  Chief Complaint  Patient presents with  . Sinusitis  . Nasal Congestion  . Cough  . URI    No Known Allergies  Patient Active Problem List   Diagnosis Date Noted  . PERIMENOPAUSAL SYNDROME 03/23/2008  . HEADACHE 03/23/2008  . SUPRAVENTRICULAR TACHYCARDIA, PAROXYSMAL, HX OF 03/23/2008    Prior to Admission medications   Medication Sig Start Date End Date Taking? Authorizing Provider  SUMAtriptan Succinate (IMITREX PO) Take 1 tablet by mouth as needed. migraine   Yes Historical Provider, MD    Medical, Surgical, Family and Social History reviewed and updated.  HPI  This 58 y.o. female presents for evaluation of illness. "Started out as just a cold." 10 days, now getting worse. "Feels like it has gone into my chest." Cough is worse at night. "My sinuses feel like they are on fire." Neti pot not working, seems clogged on the LEFT. Some sore throat. No nausea, vomiting or diarrhea. Chills, no fever.  Review of Systems As above.    Objective:   Physical Exam  Constitutional: She is oriented to person, place, and time. Vital signs are normal. She appears well-developed and well-nourished. She is active and cooperative. No distress.  BP 122/78 mmHg  Pulse 86  Temp(Src) 98.2 F (36.8 C) (Oral)  Resp 17  Ht 5\' 8"  (1.727 m)  Wt 165 lb (74.844 kg)  BMI 25.09 kg/m2  SpO2 100%   HENT:  Head: Normocephalic and atraumatic.  Right Ear: Hearing, tympanic membrane, external ear and ear canal normal.  Left Ear: Hearing, tympanic membrane, external ear and ear canal normal.  Nose: Mucosal edema and rhinorrhea present.  No foreign bodies. Right sinus exhibits maxillary sinus tenderness. Right sinus exhibits no frontal sinus tenderness. Left sinus exhibits maxillary sinus tenderness. Left sinus exhibits no frontal sinus tenderness.  Mouth/Throat: Uvula is  midline, oropharynx is clear and moist and mucous membranes are normal. No uvula swelling. No oropharyngeal exudate.  Eyes: Conjunctivae and EOM are normal. Pupils are equal, round, and reactive to light. Right eye exhibits no discharge. Left eye exhibits no discharge. No scleral icterus.  Neck: Trachea normal, normal range of motion and full passive range of motion without pain. Neck supple. No thyroid mass and no thyromegaly present.  Cardiovascular: Normal rate, regular rhythm and normal heart sounds.   Pulmonary/Chest: Effort normal and breath sounds normal.  Lymphadenopathy:       Head (right side): No submandibular, no tonsillar, no preauricular, no posterior auricular and no occipital adenopathy present.       Head (left side): No submandibular, no tonsillar, no preauricular and no occipital adenopathy present.    She has no cervical adenopathy.       Right: No supraclavicular adenopathy present.       Left: No supraclavicular adenopathy present.  Neurological: She is alert and oriented to person, place, and time. She has normal strength. No cranial nerve deficit or sensory deficit.  Skin: Skin is warm, dry and intact. No rash noted.  Psychiatric: She has a normal mood and affect. Her speech is normal and behavior is normal.  Vitals reviewed.         Assessment & Plan:  1. Acute maxillary sinusitis, recurrence not specified Supportive care.  Anticipatory guidance.  RTC if symptoms worsen/persist. - ipratropium (ATROVENT) 0.03 % nasal spray; Place 2 sprays  into both nostrils 2 (two) times daily.  Dispense: 30 mL; Refill: 0 - Guaifenesin (MUCINEX MAXIMUM STRENGTH) 1200 MG TB12; Take 1 tablet (1,200 mg total) by mouth every 12 (twelve) hours as needed.  Dispense: 14 tablet; Refill: 1 - chlorpheniramine-HYDROcodone (TUSSIONEX PENNKINETIC ER) 10-8 MG/5ML LQCR; Take 5 mLs by mouth every 12 (twelve) hours as needed for cough (cough).  Dispense: 100 mL; Refill: 0 - amoxicillin-clavulanate  (AUGMENTIN) 875-125 MG per tablet; Take 1 tablet by mouth 2 (two) times daily.  Dispense: 20 tablet; Refill: 0   Fernande Brashelle S. Unity Luepke, PA-C Physician Assistant-Certified [facility]

## 2014-06-24 ENCOUNTER — Ambulatory Visit (INDEPENDENT_AMBULATORY_CARE_PROVIDER_SITE_OTHER): Payer: BC Managed Care – PPO | Admitting: Emergency Medicine

## 2014-06-24 VITALS — BP 120/84 | HR 102 | Temp 98.0°F | Resp 18 | Ht 68.75 in | Wt 165.2 lb

## 2014-06-24 DIAGNOSIS — F411 Generalized anxiety disorder: Secondary | ICD-10-CM

## 2014-06-24 DIAGNOSIS — R110 Nausea: Secondary | ICD-10-CM

## 2014-06-24 DIAGNOSIS — R1013 Epigastric pain: Secondary | ICD-10-CM

## 2014-06-24 DIAGNOSIS — K920 Hematemesis: Secondary | ICD-10-CM

## 2014-06-24 LAB — POCT CBC
Granulocyte percent: 79.7 %G (ref 37–80)
HCT, POC: 40.5 % (ref 37.7–47.9)
Hemoglobin: 13.4 g/dL (ref 12.2–16.2)
Lymph, poc: 1.9 (ref 0.6–3.4)
MCH: 31 pg (ref 27–31.2)
MCHC: 33 g/dL (ref 31.8–35.4)
MCV: 94.1 fL (ref 80–97)
MID (CBC): 0.7 (ref 0–0.9)
MPV: 7 fL (ref 0–99.8)
PLATELET COUNT, POC: 428 10*3/uL — AB (ref 142–424)
POC Granulocyte: 10.2 — AB (ref 2–6.9)
POC LYMPH %: 15 % (ref 10–50)
POC MID %: 5.3 % (ref 0–12)
RBC: 4.3 M/uL (ref 4.04–5.48)
RDW, POC: 14.7 %
WBC: 12.8 10*3/uL — AB (ref 4.6–10.2)

## 2014-06-24 MED ORDER — CLONAZEPAM 0.5 MG PO TABS: 0.5000 mg | ORAL_TABLET | Freq: Two times a day (BID) | ORAL | Status: DC | PRN

## 2014-06-24 MED ORDER — SUCRALFATE 1 G PO TABS: 1.0000 g | ORAL_TABLET | Freq: Three times a day (TID) | ORAL | Status: DC

## 2014-06-24 MED ORDER — OMEPRAZOLE 40 MG PO CPDR: 40.0000 mg | DELAYED_RELEASE_CAPSULE | Freq: Every day | ORAL | Status: DC

## 2014-06-24 NOTE — Patient Instructions (Signed)
Take carafate before meals and at bedtime for 10 days. Take omeprazole daily. You will get a call to schedule with gastroenterology. Take klonopin 1-2 times a day for anxiety.

## 2014-06-24 NOTE — Progress Notes (Signed)
Subjective:    Patient ID: Katherine Sexton, female    DOB: 01/22/1956, 58 y.o.   MRN: [id number]  HPI  This is a 58 year old female presenting with heartburn symptoms for 1 month. She is tasting acid in her mouth and having epigastric pain. She started getting nauseated 2 days ago. She has vomited 6-7 times - there is a small amount of blood in vomit each time. She reports she has been very stressed out the past month. Her son and his wife are getting a divorce. She states she is constantly worrying and she is not sleeping well. She does not take NSAIDs. She eats a healthy diet. She does not eat a lot of acidic foods. She does not eat spicy food or drink soda. She has one cup of coffee in the mornings. She does not drink alcohol or smoke. She has never had problems with heartburn before. She denies fever, chills, diarrhea, GU symptoms, chest pain, sore throat.  Review of Systems  Constitutional: Negative for fever and chills.  HENT: Negative for sore throat.   Respiratory: Negative for cough.   Cardiovascular: Negative for chest pain.  Gastrointestinal: Positive for nausea, vomiting and abdominal pain. Negative for diarrhea and blood in stool.  Genitourinary: Negative for dysuria, vaginal discharge and difficulty urinating.  Skin: Negative for rash.  Psychiatric/Behavioral: Positive for sleep disturbance. The patient is nervous/anxious.     Patient Active Problem List   Diagnosis Date Noted  . PERIMENOPAUSAL SYNDROME 03/23/2008  . HEADACHE 03/23/2008  . SUPRAVENTRICULAR TACHYCARDIA, PAROXYSMAL, HX OF 03/23/2008   Prior to Admission medications   Medication Sig Start Date End Date Taking? Authorizing Provider  chlorpheniramine-HYDROcodone (TUSSIONEX PENNKINETIC ER) 10-8 MG/5ML LQCR Take 5 mLs by mouth every 12 (twelve) hours as needed for cough (cough). 06/14/14  Yes Chelle S Jeffery, PA-C  Guaifenesin (MUCINEX MAXIMUM STRENGTH) 1200 MG TB12 Take 1 tablet (1,200 mg total) by mouth every 12  (twelve) hours as needed. 06/14/14  Yes Chelle S Jeffery, PA-C  ipratropium (ATROVENT) 0.03 % nasal spray Place 2 sprays into both nostrils 2 (two) times daily. 06/14/14  Yes Chelle S Jeffery, PA-C  SUMAtriptan Succinate (IMITREX PO) Take 1 tablet by mouth as needed. migraine   Yes Historical Provider, MD   No Known Allergies  Patient's social and family history were reviewed.     Objective:   Physical Exam  Constitutional: She is oriented to person, place, and time. She appears well-developed and well-nourished. No distress.  HENT:  Head: Normocephalic and atraumatic.  Right Ear: Hearing normal.  Left Ear: Hearing normal.  Nose: Nose normal.  Mouth/Throat: Uvula is midline and oropharynx is clear and moist.  Eyes: Conjunctivae and lids are normal. Right eye exhibits no discharge. Left eye exhibits no discharge. No scleral icterus.  Cardiovascular: Normal rate, regular rhythm, normal heart sounds, intact distal pulses and normal pulses.   No murmur heard. Pulmonary/Chest: Effort normal and breath sounds normal. No respiratory distress. She has no wheezes. She has no rhonchi. She has no rales. She exhibits no tenderness.  Abdominal: Soft. Normal appearance and bowel sounds are normal. There is tenderness in the epigastric area. There is no rebound, no guarding and negative Murphy's sign.  Musculoskeletal: Normal range of motion.  Lymphadenopathy:    She has no cervical adenopathy.  Neurological: She is alert and oriented to person, place, and time.  Skin: Skin is warm, dry and intact. No lesion and no rash noted.  Psychiatric: She has a normal  mood and affect. Her speech is normal and behavior is normal. Thought content normal.   Results for orders placed or performed in visit on 06/24/14  POCT CBC  Result Value Ref Range   WBC 12.8 (A) 4.6 - 10.2 K/uL   Lymph, poc 1.9 0.6 - 3.4   POC LYMPH PERCENT 15.0 10 - 50 %L   MID (cbc) 0.7 0 - 0.9   POC MID % 5.3 0 - 12 %M   POC  Granulocyte 10.2 (A) 2 - 6.9   Granulocyte percent 79.7 37 - 80 %G   RBC 4.30 4.04 - 5.48 M/uL   Hemoglobin 13.4 12.2 - 16.2 g/dL   HCT, POC 62.940.5 52.837.7 - 47.9 %   MCV 94.1 80 - 97 fL   MCH, POC 31.0 27 - 31.2 pg   MCHC 33.0 31.8 - 35.4 g/dL   RDW, POC 41.314.7 %   Platelet Count, POC 428 (A) 142 - 424 K/uL   MPV 7.0 0 - 99.8 fL      Assessment & Plan:  1. Abdominal pain, epigastric 2. Hematemesis with nausea Pt likely has gastritis. CBC with normal hgb and an elevated wbc to 12.8. H pylori and CMP pending. Pt will take 10 days of carafate and daily omeprazole. She was referred to GI for possible upper endoscopy due to bloody emesis.  - HELICOBACTER PYLORI  ANTIBODY, IGM - POCT CBC - Comprehensive metabolic panel - Ambulatory referral to Gastroenterology - sucralfate (CARAFATE) 1 G tablet; Take 1 tablet (1 g total) by mouth 4 (four) times daily -  with meals and at bedtime.  Dispense: 40 tablet; Refill: 0 - omeprazole (PRILOSEC) 40 MG capsule; Take 1 capsule (40 mg total) by mouth daily.  Dispense: 30 capsule; Refill: 3  3. Anxiety state TSH pending. Prescribed Klonopin for situational anxiety and worry.  - TSH - clonazePAM (KLONOPIN) 0.5 MG tablet; Take 1 tablet (0.5 mg total) by mouth 2 (two) times daily as needed for anxiety.  Dispense: 30 tablet; Refill: 1   Tryone Kille V. Dyke BrackettBush, PA-C, MHS [facility]  06/24/2014

## 2014-06-25 LAB — COMPREHENSIVE METABOLIC PANEL
ALK PHOS: 80 U/L (ref 39–117)
ALT: 15 U/L (ref 0–35)
AST: 16 U/L (ref 0–37)
Albumin: 4.6 g/dL (ref 3.5–5.2)
BILIRUBIN TOTAL: 0.7 mg/dL (ref 0.2–1.2)
BUN: 12 mg/dL (ref 6–23)
CALCIUM: 11 mg/dL — AB (ref 8.4–10.5)
CO2: 26 mEq/L (ref 19–32)
Chloride: 98 mEq/L (ref 96–112)
Creat: 0.72 mg/dL (ref 0.50–1.10)
Glucose, Bld: 107 mg/dL — ABNORMAL HIGH (ref 70–99)
Potassium: 4.2 mEq/L (ref 3.5–5.3)
Sodium: 140 mEq/L (ref 135–145)
Total Protein: 7.9 g/dL (ref 6.0–8.3)

## 2014-06-25 LAB — TSH: TSH: 1.4 u[IU]/mL (ref 0.350–4.500)

## 2014-06-28 LAB — HELICOBACTER PYLORI  ANTIBODY, IGM: HELICOBACTER PYLORI, IGM: 1.4 U/mL (ref <9.0)

## 2014-06-29 ENCOUNTER — Ambulatory Visit (INDEPENDENT_AMBULATORY_CARE_PROVIDER_SITE_OTHER): Payer: BC Managed Care – PPO | Admitting: Physician Assistant

## 2014-06-29 ENCOUNTER — Encounter: Payer: Self-pay | Admitting: Physician Assistant

## 2014-06-29 VITALS — BP 117/62 | HR 92 | Ht 67.25 in | Wt 164.5 lb

## 2014-06-29 DIAGNOSIS — R1013 Epigastric pain: Secondary | ICD-10-CM

## 2014-06-29 DIAGNOSIS — R110 Nausea: Secondary | ICD-10-CM

## 2014-06-29 DIAGNOSIS — K920 Hematemesis: Secondary | ICD-10-CM

## 2014-06-29 MED ORDER — OMEPRAZOLE 40 MG PO CPDR: 40.0000 mg | DELAYED_RELEASE_CAPSULE | Freq: Every day | ORAL | Status: DC

## 2014-06-29 NOTE — Patient Instructions (Signed)
We sent refills for the Omeprazole 40 mg to [facility] [address]. Stay on Carafate for 2 weeks in between meals.  We have given you information on reflux. We have also given you Endoscopy and Colonoscopy brochures. Call us back early January to scheduled procedures ( Endoscopy and colonoscopy) with Dr. Erick BlinksJay Pyrtle.

## 2014-06-29 NOTE — Progress Notes (Addendum)
Patient ID: Jacinta ShoeMary Kleinpeter, female   DOB: 06/02/1956, 58 y.o.   MRN: [id number]   Subjective:    Patient ID: Jacinta ShoeMary Malerba, female    DOB: 06/02/1956, 58 y.o.   MRN: [id number]  HPI Corrie DandyMary is a pleasant 58 year old white female new to GI referred by urgent care for evaluation of epigastric discomfort and heartburn. Asian has not had prior GI evaluation. She states that she had been extremely stressed over the past 4-6 weeks about her son's separation from his wife. She says she "worried herself sick".  She developed heartburn and indigestion to the point of having nausea and some vomiting. With vomiting she had seen some streaks of blood. Has not noticed any melena or hematochezia. Since that time she has altered her diet and gone off of coffee and carbonated beverages. She's not been using any aspirin or NSAIDs. She was seen by urgent care and started on Carafate 4 times daily and omeprazole 40 mg once daily. She says over the past week with medication she feels much better and her symptoms have all but subsided. She denies any dysphagia or odynophagia. She says her appetite is fine her weight has been stable. Labs done 06/24/2014 hemoglobin 13.4 hematocrit of 40.5 to BBC of 12.8 , CMET unremarkable H. pylori antibody was negative. Family history pertinent for mother with polyps. Patient has not had colonoscopy though she knows she should.  Review of Systems Pertinent positive and negative review of systems were noted in the above HPI section.  All other review of systems was otherwise negative.  Outpatient Encounter Prescriptions as of 06/29/2014  Medication Sig  . clonazePAM (KLONOPIN) 0.5 MG tablet Take 1 tablet (0.5 mg total) by mouth 2 (two) times daily as needed for anxiety.  Marland Kitchen. ipratropium (ATROVENT) 0.03 % nasal spray Place 2 sprays into both nostrils 2 (two) times daily.  Marland Kitchen. omeprazole (PRILOSEC) 40 MG capsule Take 1 capsule (40 mg total) by mouth daily.  . sucralfate (CARAFATE) 1 G tablet Take 1  tablet (1 g total) by mouth 4 (four) times daily -  with meals and at bedtime.  . SUMAtriptan Succinate (IMITREX PO) Take 1 tablet by mouth as needed. migraine  . [DISCONTINUED] chlorpheniramine-HYDROcodone (TUSSIONEX PENNKINETIC ER) 10-8 MG/5ML LQCR Take 5 mLs by mouth every 12 (twelve) hours as needed for cough (cough).  . [DISCONTINUED] Guaifenesin (MUCINEX MAXIMUM STRENGTH) 1200 MG TB12 Take 1 tablet (1,200 mg total) by mouth every 12 (twelve) hours as needed.  . [DISCONTINUED] omeprazole (PRILOSEC) 40 MG capsule Take 1 capsule (40 mg total) by mouth daily.   No Known Allergies Patient Active Problem List   Diagnosis Date Noted  . PERIMENOPAUSAL SYNDROME 03/23/2008  . HEADACHE 03/23/2008  . SUPRAVENTRICULAR TACHYCARDIA, PAROXYSMAL, HX OF 03/23/2008   History   Social History  . Marital Status: Married    Spouse Name: John    Number of Children: 2  . Years of Education: College   Occupational History  . Homemaker    Social History Main Topics  . Smoking status: Never Smoker   . Smokeless tobacco: Never Used  . Alcohol Use: 0.0 oz/week    0 Not specified per week     Comment: Occassionally  . Drug Use: No  . Sexual Activity: Not on file   Other Topics Concern  . Not on file   Social History Narrative   Lives with her husband.   [place] in [place].   Her [place] lives in [place], [place].  Ms. Corine ShelterHardy's family history includes Colon polyps in her mother; Heart disease in her father; Hypertension in her father and mother; Osteoporosis in her mother; Renal cancer in her father. There is no history of Colon cancer, Diabetes, Esophageal cancer, or Gallbladder disease.      Objective:    Filed Vitals:   06/29/14 0941  BP: 117/62  Pulse: 92    Physical Exam  well-developed white female in no acute distress, pleasant height 5 foot 7 weight 164. HEENT; nontraumatic normocephalic EOMI PERRLA sclera anicteric, Supple ;no JVD, Cardiovascular; regular rate and  rhythm with S1-S2 no murmur rub or gallop, Pulm;clear bilaterally, Abdomen;oft basically nontender nondistended bowel sounds are active there is no palpable mass or hepatosplenomegaly, Rectal; exam not done, , Skin; No Clubbing Cyanosis or Edema Skin Warm and Dry, Psych; Mood and Affect Appropriate     Assessment & Plan:   #421 58 year old female with 1 month history of heartburn and indigestion nausea and small volume streaky hematemesis 1. Symptoms all much improved after 1 week of PPI and sucralfate. I suspect she had an esophagitis possibly reflux induced versus gastritis. #2 Colon  Neoplasia surveillance- asymptomatic and of average risk has not had colonoscopy  Plan; She will continue Carafate 1 g between meals 3 times daily and at bedtime for 2 more weeks and continue on omeprazole 40 mg by mouth daily refills given We discussed upper endoscopy but she would like to hold off for now as she is planning to go to [place] to help take care of her son's children over the next few weeks. Also discussed colonoscopy which she is agreeable to proceed with ,however wants to wait till after the first of the year. We agreed that she would call back in January to schedule EGD and colonoscopy with Dr.Pyrtle.  Julus Kelley S Vian Fluegel PA-C 06/29/2014   Addendum: Reviewed and agree with initial management. Beverley FiedlerJay M Pyrtle, MD

## 2015-04-07 ENCOUNTER — Encounter: Payer: Self-pay | Admitting: Internal Medicine

## 2015-04-18 ENCOUNTER — Ambulatory Visit (INDEPENDENT_AMBULATORY_CARE_PROVIDER_SITE_OTHER): Payer: BLUE CROSS/BLUE SHIELD | Admitting: Urgent Care

## 2015-04-18 VITALS — BP 124/82 | HR 97 | Temp 98.7°F | Resp 16 | Ht 69.75 in | Wt 171.8 lb

## 2015-04-18 DIAGNOSIS — R0981 Nasal congestion: Secondary | ICD-10-CM | POA: Diagnosis not present

## 2015-04-18 DIAGNOSIS — R05 Cough: Secondary | ICD-10-CM | POA: Diagnosis not present

## 2015-04-18 DIAGNOSIS — J069 Acute upper respiratory infection, unspecified: Secondary | ICD-10-CM | POA: Diagnosis not present

## 2015-04-18 DIAGNOSIS — R059 Cough, unspecified: Secondary | ICD-10-CM

## 2015-04-18 MED ORDER — HYDROCODONE-HOMATROPINE 5-1.5 MG/5ML PO SYRP: 5.0000 mL | ORAL_SOLUTION | Freq: Every evening | ORAL | Status: DC | PRN

## 2015-04-18 MED ORDER — BENZONATATE 100 MG PO CAPS: 100.0000 mg | ORAL_CAPSULE | Freq: Three times a day (TID) | ORAL | Status: DC | PRN

## 2015-04-18 NOTE — Progress Notes (Signed)
    MRN: [id number] DOB: 1956-03-04  Subjective:   Katherine Sexton is a 59 y.o. female presenting for chief complaint of Cough and Fatigue  Reports 1 week history of cold symptoms including nasal congestion, scratchy sore throat. Developed productive cough over the weekend, worse at night, interrupting patient's sleep. Also has right ear pressure, right lymph node pain, right sinus pressure. Has tried NyQuil without any relief. Denies fever, sinus headache, ear pain, drainage, itchy or watery red eyes, tooth pain, chest pain, shortness of breath, wheezing, chest tightness, nausea, vomiting, abdominal pain. Denies smoking cigarettes. Denies history of seasonal allergies or asthma. Denies any other aggravating or relieving factors, no other questions or concerns.  Katherine Sexton has a current medication list which includes the following prescription(s): omeprazole, sucralfate, and sumatriptan succinate. Also has No Known Allergies.  Katherine Sexton  has a past medical history of Migraines; PSVT (paroxysmal supraventricular tachycardia) (HCC); Anxiety; and Urinary tract infection. Also  has past surgical history that includes Abdominal hysterectomy and Tubal ligation.  Objective:   Vitals: BP 124/82 mmHg  Pulse 97  Temp(Src) 98.7 F (37.1 C) (Oral)  Resp 16  Ht 5' 9.75" (1.772 m)  Wt 171 lb 12.8 oz (77.928 kg)  BMI 24.82 kg/m2  SpO2 98%  Physical Exam  Constitutional: She is oriented to person, place, and time. She appears well-developed and well-nourished.  HENT:  TM's intact bilaterally, no effusions or erythema. Right nasal turbinates slight inflamed with mild right-sided maxillary sinus tenderness. Postnasal drip present, without oropharyngeal exudates, erythema or abscesses.  Eyes: Conjunctivae are normal. Right eye exhibits no discharge. Left eye exhibits no discharge. No scleral icterus.  Neck: Normal range of motion. Neck supple.  Cardiovascular: Normal rate, regular rhythm and intact distal pulses.   Exam reveals no gallop and no friction rub.   No murmur heard. Pulmonary/Chest: No respiratory distress. She has no wheezes. She has no rales.  Musculoskeletal: She exhibits no edema.  Lymphadenopathy:    She has no cervical adenopathy.  Neurological: She is alert and oriented to person, place, and time.  Skin: Skin is warm and dry. No rash noted. No erythema. No pallor.    Assessment and Plan :   1. Upper respiratory infection 2. Cough 3. Nasal congestion - Likely viral in etiology, otherwise supportive care, Hycodan and Tessalon Perles for cough. The patient has no improvement in about 5 days, she is to return to clinic or call to let us know what her symptoms are. Consider antibiotic course at that point.  Wallis Bamberg, PA-C [facility] [phone] 04/18/2015 8:24 AM

## 2015-04-18 NOTE — Patient Instructions (Signed)
Upper Respiratory Infection, Adult An upper respiratory infection (URI) is also sometimes known as the common cold. The upper respiratory tract includes the nose, sinuses, throat, trachea, and bronchi. Bronchi are the airways leading to the lungs. Most people improve within 1 week, but symptoms can last up to 2 weeks. A residual cough may last even longer.  CAUSES Many different viruses can infect the tissues lining the upper respiratory tract. The tissues become irritated and inflamed and often become very moist. Mucus production is also common. A cold is contagious. You can easily spread the virus to others by oral contact. This includes kissing, sharing a glass, coughing, or sneezing. Touching your mouth or nose and then touching a surface, which is then touched by another person, can also spread the virus. SYMPTOMS  Symptoms typically develop 1 to 3 days after you come in contact with a cold virus. Symptoms vary from person to person. They may include:  Runny nose.  Sneezing.  Nasal congestion.  Sinus irritation.  Sore throat.  Loss of voice (laryngitis).  Cough.  Fatigue.  Muscle aches.  Loss of appetite.  Headache.  Low-grade fever. DIAGNOSIS  You might diagnose your own cold based on familiar symptoms, since most people get a cold 2 to 3 times a year. Your caregiver can confirm this based on your exam. Most importantly, your caregiver can check that your symptoms are not due to another disease such as strep throat, sinusitis, pneumonia, asthma, or epiglottitis. Blood tests, throat tests, and X-rays are not necessary to diagnose a common cold, but they may sometimes be helpful in excluding other more serious diseases. Your caregiver will decide if any further tests are required. RISKS AND COMPLICATIONS  You may be at risk for a more severe case of the common cold if you smoke cigarettes, have chronic heart disease (such as heart failure) or lung disease (such as asthma), or if  you have a weakened immune system. The very young and very old are also at risk for more serious infections. Bacterial sinusitis, middle ear infections, and bacterial pneumonia can complicate the common cold. The common cold can worsen asthma and chronic obstructive pulmonary disease (COPD). Sometimes, these complications can require emergency medical care and may be life-threatening. PREVENTION  The best way to protect against getting a cold is to practice good hygiene. Avoid oral or hand contact with people with cold symptoms. Wash your hands often if contact occurs. There is no clear evidence that vitamin C, vitamin E, echinacea, or exercise reduces the chance of developing a cold. However, it is always recommended to get plenty of rest and practice good nutrition. TREATMENT  Treatment is directed at relieving symptoms. There is no cure. Antibiotics are not effective, because the infection is caused by a virus, not by bacteria. Treatment may include:  Increased fluid intake. Sports drinks offer valuable electrolytes, sugars, and fluids.  Breathing heated mist or steam (vaporizer or shower).  Eating chicken soup or other clear broths, and maintaining good nutrition.  Getting plenty of rest.  Using gargles or lozenges for comfort.  Controlling fevers with ibuprofen or acetaminophen as directed by your caregiver.  Increasing usage of your inhaler if you have asthma. Zinc gel and zinc lozenges, taken in the first 24 hours of the common cold, can shorten the duration and lessen the severity of symptoms. Pain medicines may help with fever, muscle aches, and throat pain. A variety of non-prescription medicines are available to treat congestion and runny nose. Your caregiver   can make recommendations and may suggest nasal or lung inhalers for other symptoms.  HOME CARE INSTRUCTIONS   Only take over-the-counter or prescription medicines for pain, discomfort, or fever as directed by your  caregiver.  Use a warm mist humidifier or inhale steam from a shower to increase air moisture. This may keep secretions moist and make it easier to breathe.  Drink enough water and fluids to keep your urine clear or pale yellow.  Rest as needed.  Return to work when your temperature has returned to normal or as your caregiver advises. You may need to stay home longer to avoid infecting others. You can also use a face mask and careful hand washing to prevent spread of the virus. SEEK MEDICAL CARE IF:   After the first few days, you feel you are getting worse rather than better.  You need your caregiver's advice about medicines to control symptoms.  You develop chills, worsening shortness of breath, or brown or red sputum. These may be signs of pneumonia.  You develop yellow or brown nasal discharge or pain in the face, especially when you bend forward. These may be signs of sinusitis.  You develop a fever, swollen neck glands, pain with swallowing, or white areas in the back of your throat. These may be signs of strep throat. SEEK IMMEDIATE MEDICAL CARE IF:   You have a fever.  You develop severe or persistent headache, ear pain, sinus pain, or chest pain.  You develop wheezing, a prolonged cough, cough up blood, or have a change in your usual mucus (if you have chronic lung disease).  You develop sore muscles or a stiff neck. Document Released: 12/26/2000 Document Revised: 09/24/2011 Document Reviewed: 10/07/2013 ExitCare Patient Information 2015 [employer]. This information is not intended to replace advice given to you by your health care provider. Make sure you discuss any questions you have with your health care provider.  

## 2015-07-02 IMAGING — CR DG TIBIA/FIBULA 2V*R*
2 series · 2 of 2 positions shown · non-contrast
Comparison: None.

CLINICAL DATA: 57-year-old female with pain after fall. Initial
encounter.

EXAM:
RIGHT TIBIA AND FIBULA - 2 VIEW

[AP]
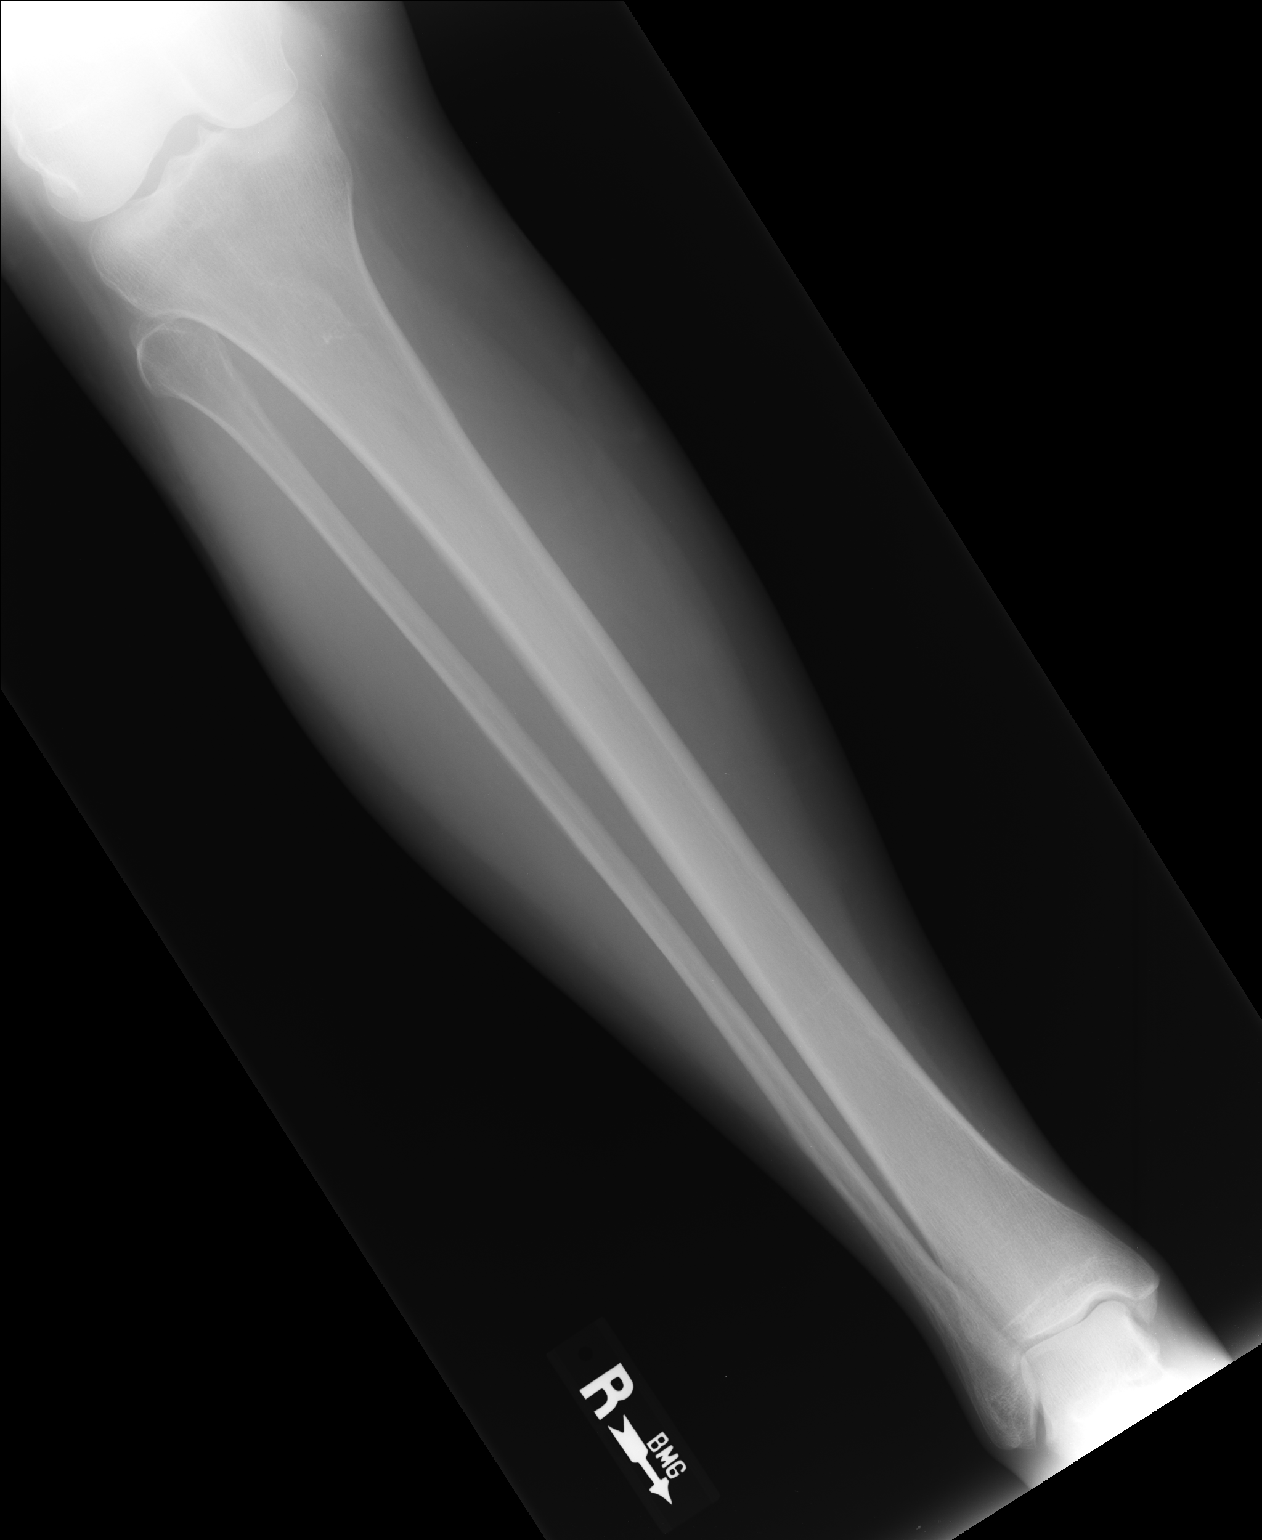

[lateral]
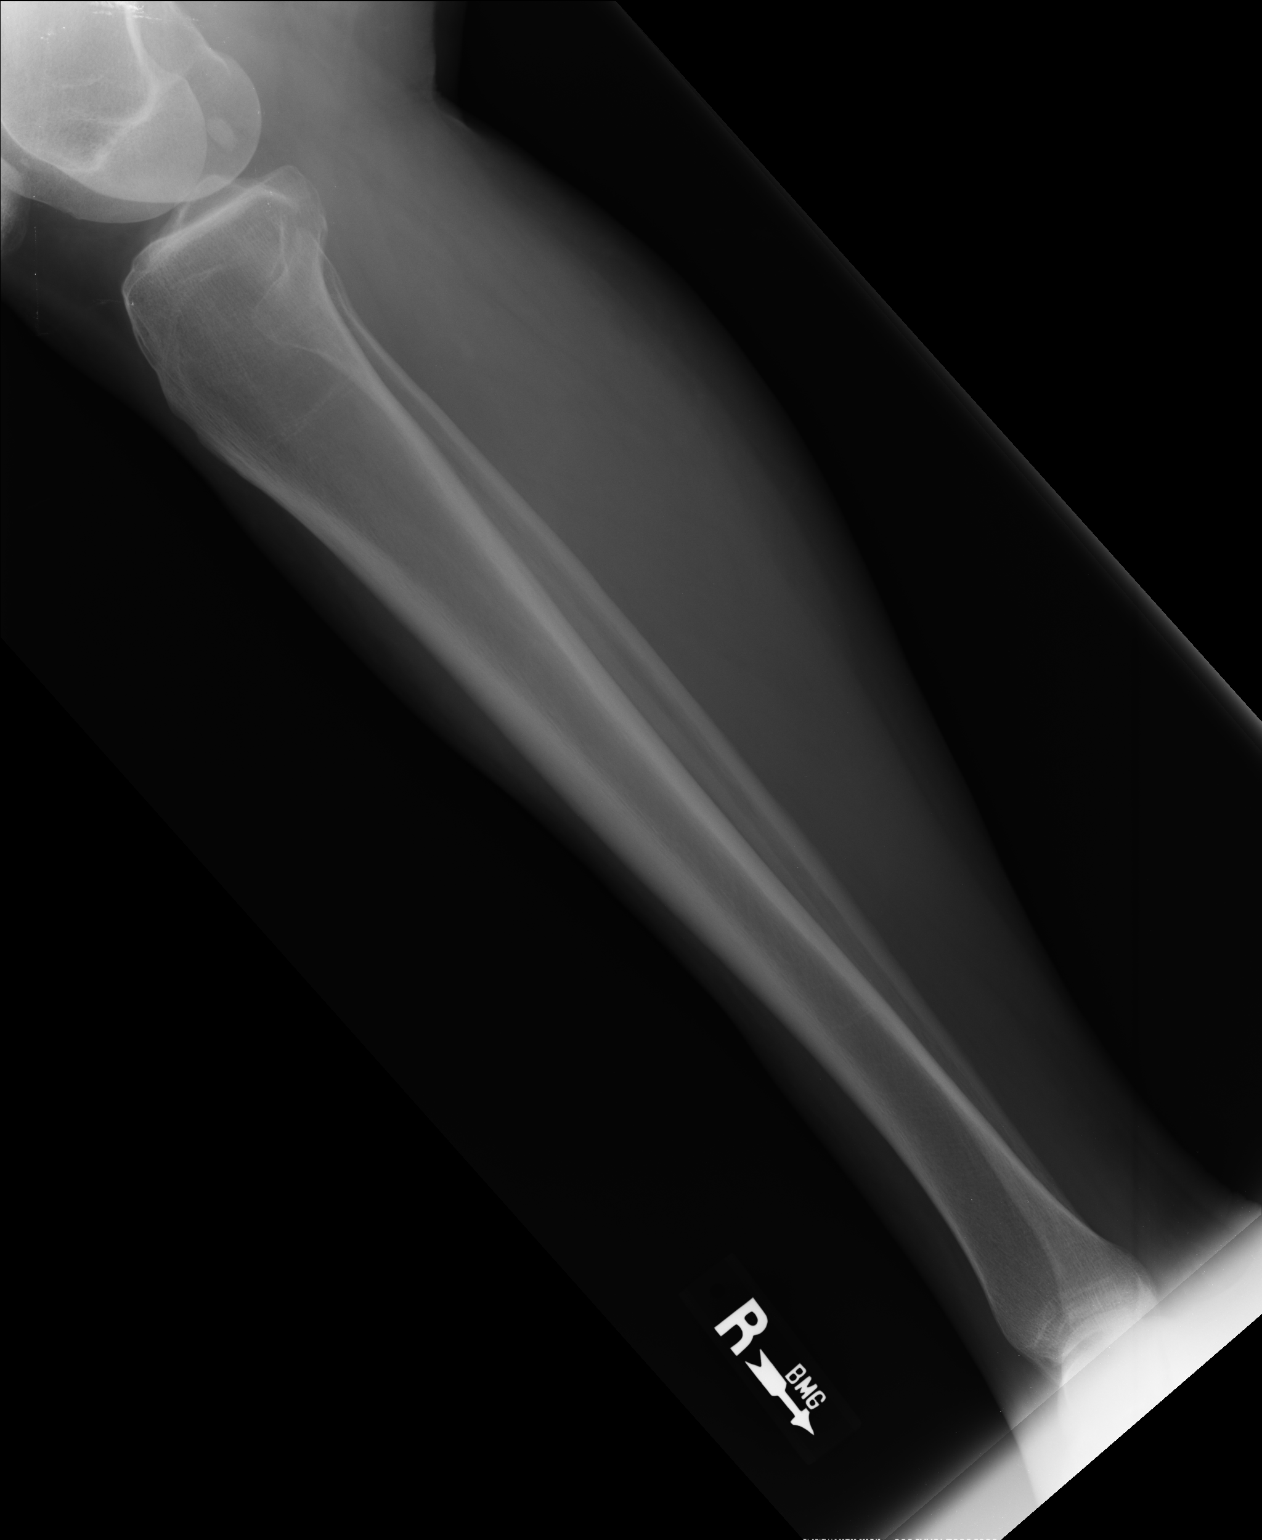

[2 of 2 positions shown; findings below may reference images not displayed]

FINDINGS: Bone mineralization is within normal limits. Grossly normal
alignment at the right knee and ankle. No tibia or fibula fracture
identified. No radiopaque foreign body identified.
IMPRESSION: No acute fracture or dislocation identified about the right tib-fib.

## 2015-09-03 IMAGING — CR DG CHEST 2V
2 series · 2 of 2 positions shown · non-contrast
Comparison: None.

CLINICAL DATA: Cough with generalized body aches 5 days.

EXAM:
CHEST  2 VIEW

[PA]
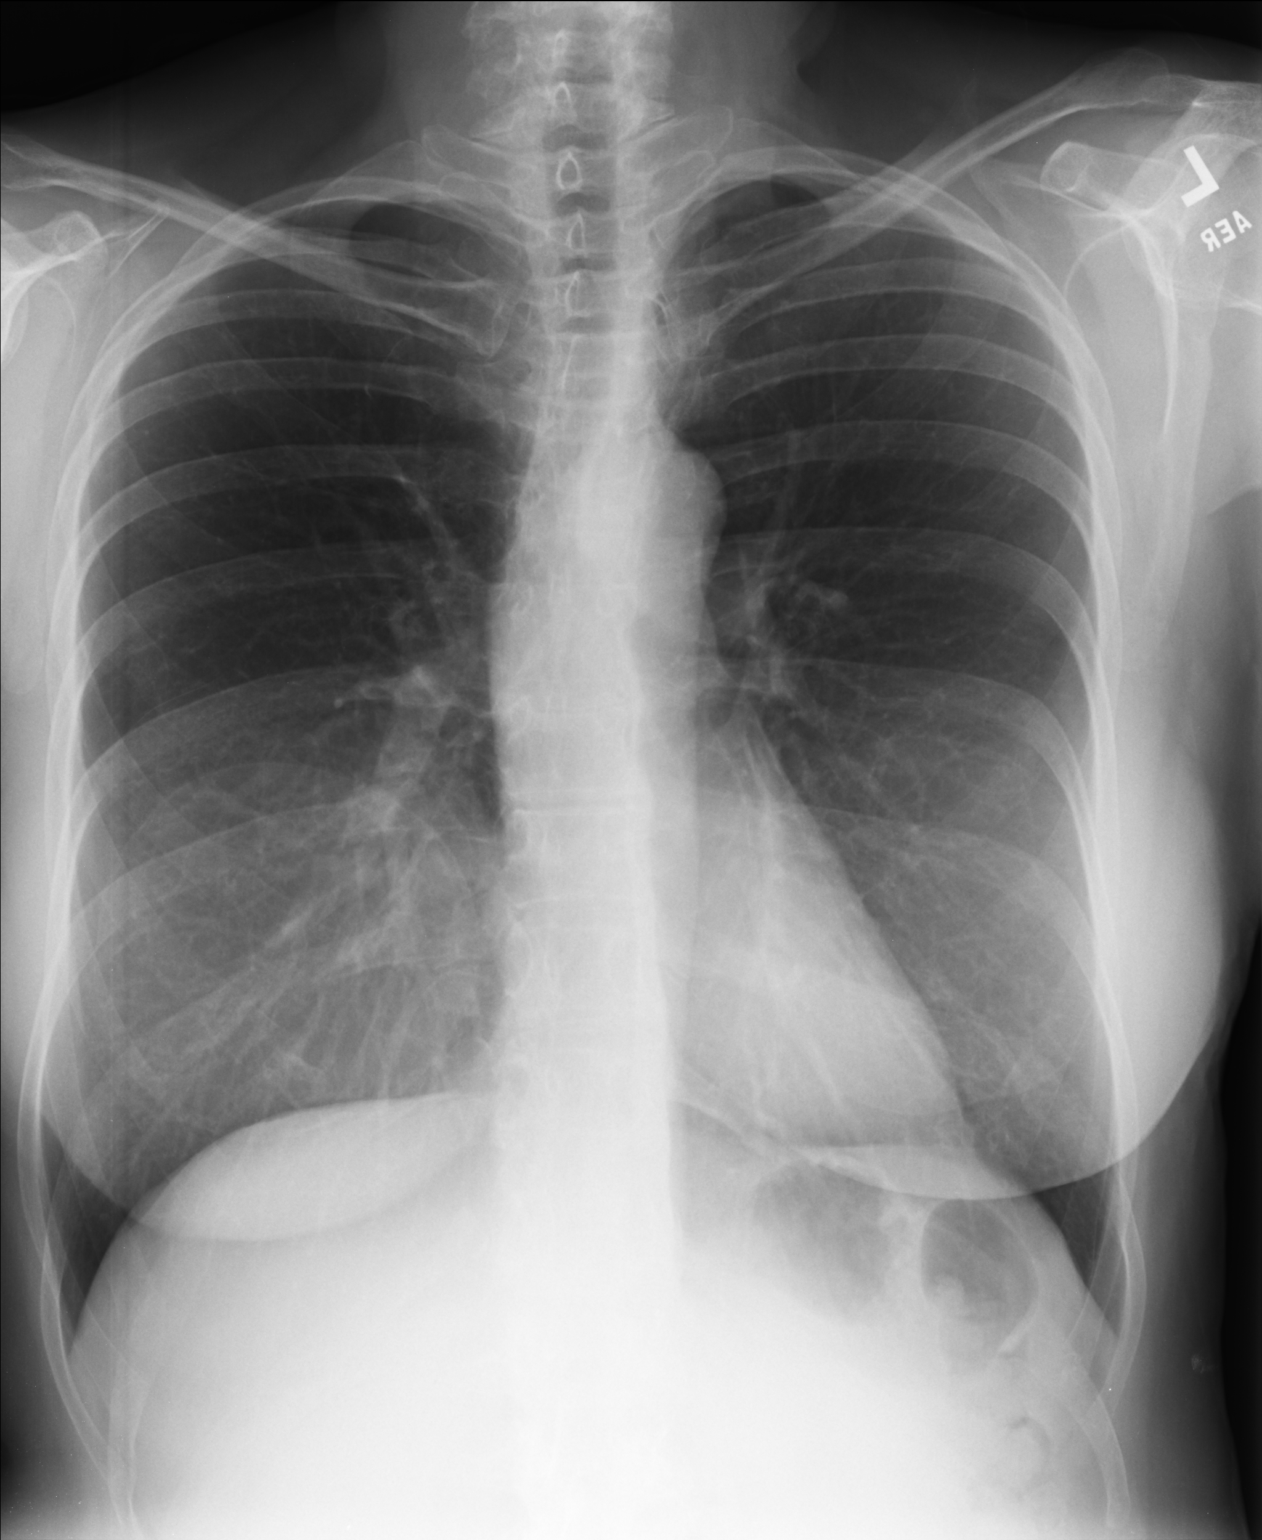

[lateral]
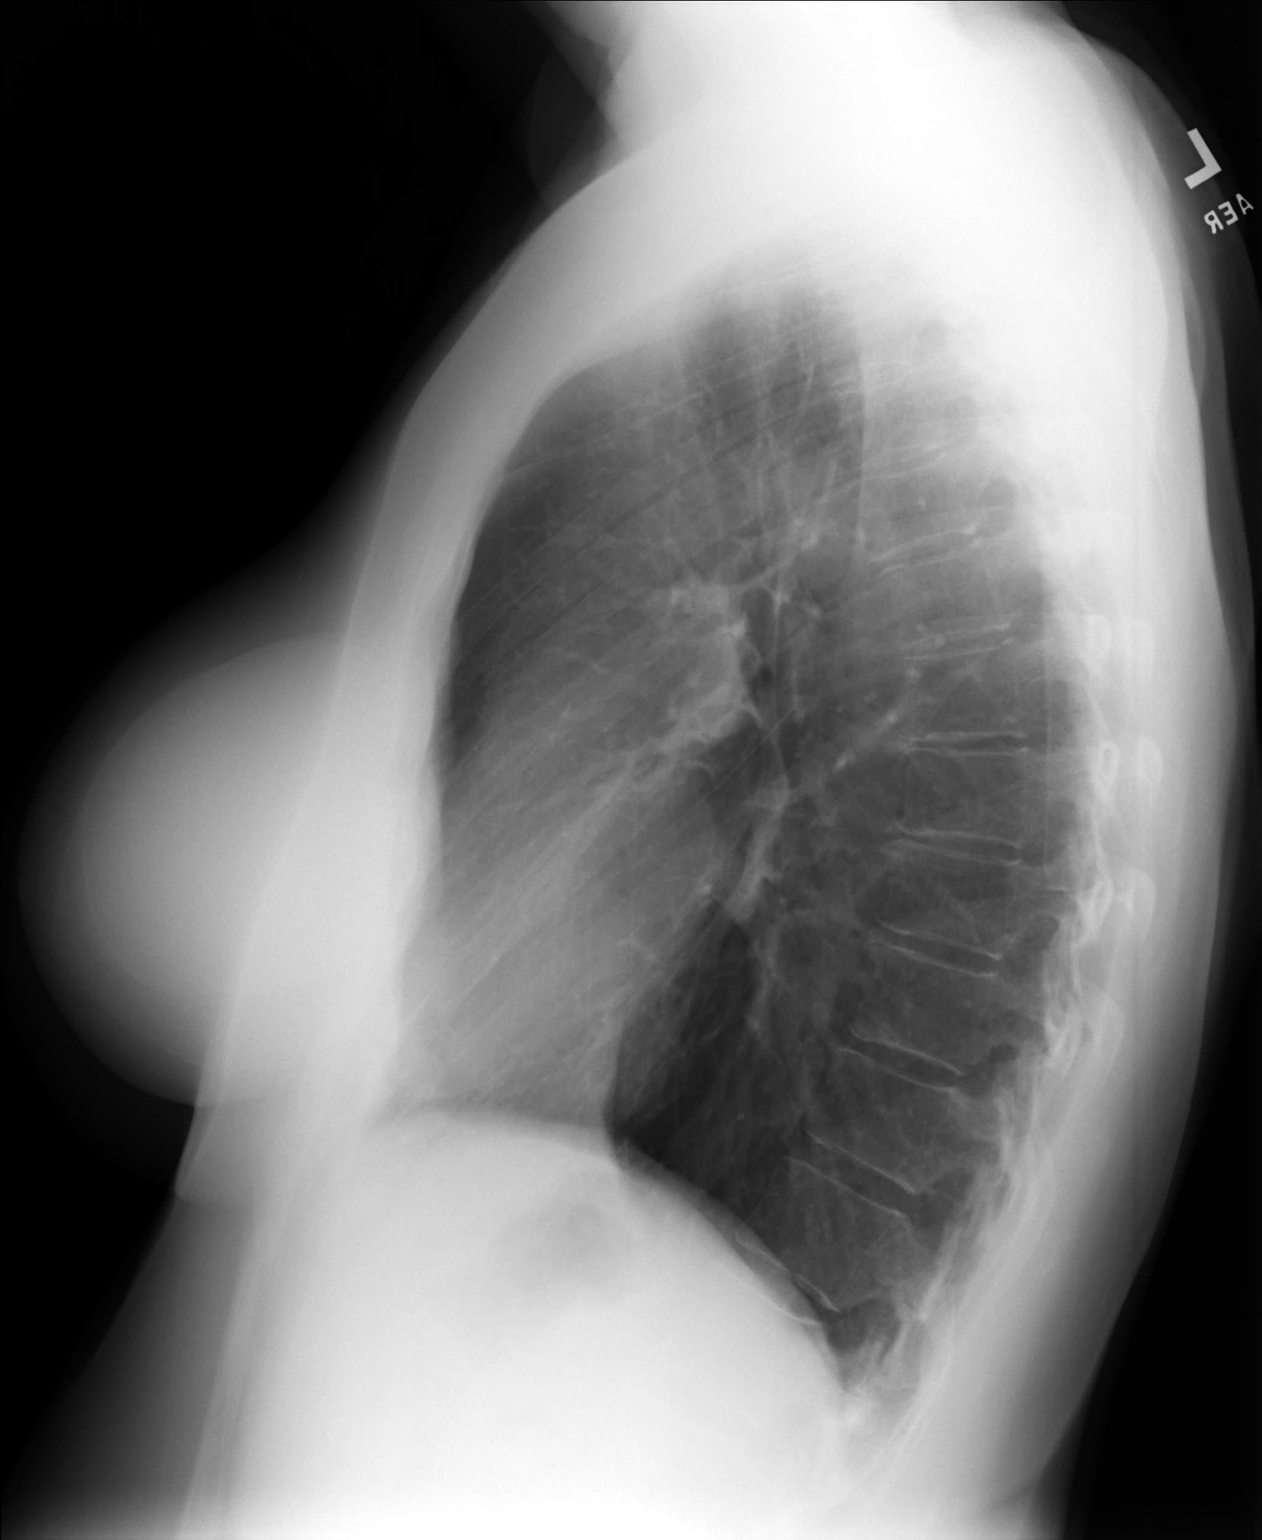

[2 of 2 positions shown; findings below may reference images not displayed]

FINDINGS: Lungs are adequately inflated and otherwise clear. Cardiomediastinal
silhouette is within normal. Remaining bones soft tissues are
unremarkable.
IMPRESSION: No active cardiopulmonary disease.

## 2016-07-06 ENCOUNTER — Ambulatory Visit (INDEPENDENT_AMBULATORY_CARE_PROVIDER_SITE_OTHER): Payer: 59 | Admitting: Physician Assistant

## 2016-07-06 VITALS — BP 122/70 | HR 92 | Temp 98.3°F | Resp 18 | Ht 69.75 in | Wt 167.0 lb

## 2016-07-06 DIAGNOSIS — J069 Acute upper respiratory infection, unspecified: Secondary | ICD-10-CM | POA: Diagnosis not present

## 2016-07-06 DIAGNOSIS — R05 Cough: Secondary | ICD-10-CM | POA: Diagnosis not present

## 2016-07-06 DIAGNOSIS — B9789 Other viral agents as the cause of diseases classified elsewhere: Secondary | ICD-10-CM

## 2016-07-06 DIAGNOSIS — R059 Cough, unspecified: Secondary | ICD-10-CM

## 2016-07-06 MED ORDER — HYDROCODONE-HOMATROPINE 5-1.5 MG/5ML PO SYRP: 5.0000 mL | ORAL_SOLUTION | Freq: Every evening | ORAL | 0 refills | Status: DC | PRN

## 2016-07-06 MED ORDER — GUAIFENESIN ER 1200 MG PO TB12: 1.0000 | ORAL_TABLET | Freq: Two times a day (BID) | ORAL | 1 refills | Status: DC | PRN

## 2016-07-06 MED ORDER — AZITHROMYCIN 250 MG PO TABS: ORAL_TABLET | ORAL | 0 refills | Status: DC

## 2016-07-06 MED ORDER — BENZONATATE 100 MG PO CAPS: 100.0000 mg | ORAL_CAPSULE | Freq: Three times a day (TID) | ORAL | 0 refills | Status: DC | PRN

## 2016-07-06 NOTE — Patient Instructions (Addendum)
Please hydrate well with 64 oz of water.  You can of course continue to drink orange juice. Let's treat this supportively as this is likely a virus.  If you are having no improvement within the next 3 days, fill the antibiotic prescription AND take to completion.     IF you received an x-ray today, you will receive an invoice from [facility]. Please contact [facility] at [phone] with questions or concerns regarding your invoice.   IF you received labwork today, you will receive an invoice from [facility]. Please contact [facility] at [phone] with questions or concerns regarding your invoice.   Our billing staff will not be able to assist you with questions regarding bills from these companies.  You will be contacted with the lab results as soon as they are available. The fastest way to get your results is to activate your My Chart account. Instructions are located on the last page of this paperwork. If you have not heard from us regarding the results in 2 weeks, please contact this office.

## 2016-07-06 NOTE — Progress Notes (Signed)
[facility] [address], [place] [address] [phone]  Date:  07/06/2016   Name:  Katherine ShoeMary Sexton   DOB:  [birth year]   MRN:  [id number]  PCP:  Marga MelnickWilliam Hopper, MD    History of Present Illness:  Katherine Sexton patient who [facility] cc of cough, headache, and chest tightness.    --7 days of cough.  Symptoms started with some congestion, that progressived to a cough.  There is no sob or dyspnea, but she does feel as if her chest is tight.  Cough worsened at night.  She has headache secondary to the coughing.  She feels as if she is starting to have improvement.  No fever. She has been drinking a lot of orange juice.  Not a lot of water.  She has not taken any medication or other modalities to help her illness.   Patient Active Problem List   Diagnosis Date Noted  . Upper respiratory infection 04/18/2015  . PERIMENOPAUSAL SYNDROME 03/23/2008  . HEADACHE 03/23/2008  . SUPRAVENTRICULAR TACHYCARDIA, PAROXYSMAL, HX OF 03/23/2008    Past Medical History:  Diagnosis Date  . Anxiety   . Migraines   . PSVT (paroxysmal supraventricular tachycardia) (HCC)   . Urinary tract infection     Past Surgical History:  Procedure Laterality Date  . ABDOMINAL HYSTERECTOMY     for prolapse  . TUBAL LIGATION      Social History  Substance Use Topics  . Smoking status: Never Smoker  . Smokeless tobacco: Never Used  . Alcohol use 0.0 oz/week     Comment: Occassionally    Family History  Problem Relation Age of Onset  . Hypertension Mother   . Osteoporosis Mother   . Colon polyps Mother   . Hypertension Father   . Heart disease Father     CABG  . Renal cancer Father   . Colon cancer Neg Hx   . Diabetes Neg Hx   . Esophageal cancer Neg Hx   . Gallbladder disease Neg Hx     No Known Allergies  Medication list has been reviewed and updated.  Current Outpatient Prescriptions on File Prior to Visit  Medication Sig Dispense Refill  .  SUMAtriptan Succinate (IMITREX PO) Take 1 tablet by mouth as needed. migraine    . sucralfate (CARAFATE) 1 G tablet Take 1 tablet (1 g total) by mouth 4 (four) times daily -  with meals and at bedtime. (Patient not taking: Reported on 07/06/2016) 40 tablet 0   No current facility-administered medications on file prior to visit.     ROS ROS otherwise unremarkable unless listed above.  Physical Examination: BP 122/70 (BP Location: Right Arm, Patient Position: Sitting, Cuff Size: Small)   Pulse 92   Temp 98.3 F (36.8 C) (Oral)   Resp 18   Ht 5' 9.75" (1.772 m)   Wt 167 lb (75.8 kg)   SpO2 98%   BMI 24.13 kg/m  Ideal Body Weight: Weight in (lb) to have BMI = 25: 172.6  Physical Exam  Constitutional: She is oriented to person, place, and time. She appears well-developed and well-nourished. No distress.  HENT:  Head: Normocephalic and atraumatic.  Right Ear: Tympanic membrane, external ear and ear canal normal.  Left Ear: Tympanic membrane, external ear and ear canal normal.  Nose: Mucosal edema and rhinorrhea present. Right sinus exhibits no maxillary sinus tenderness and no frontal sinus tenderness. Left sinus exhibits no maxillary  sinus tenderness and no frontal sinus tenderness.  Mouth/Throat: No uvula swelling. No oropharyngeal exudate, posterior oropharyngeal edema or posterior oropharyngeal erythema.  Eyes: Conjunctivae and EOM are normal. Pupils are equal, round, and reactive to light.  Cardiovascular: Normal rate and regular rhythm.  Exam reveals no gallop, no distant heart sounds and no friction rub.   No murmur heard. Pulmonary/Chest: Effort normal. No respiratory distress. She has no decreased breath sounds. She has no wheezes. She has no rhonchi.  Lymphadenopathy:       Head (right side): No submandibular, no tonsillar, no preauricular and no posterior auricular adenopathy present.       Head (left side): No submandibular, no tonsillar, no preauricular and no posterior  auricular adenopathy present.  Neurological: She is alert and oriented to person, place, and time.  Skin: She is not diaphoretic.  Psychiatric: She has a normal mood and affect. Her behavior is normal.     Assessment and Plan: Katherine Sexton who is here today for cc of cough, headache, and chest tightness Will treat supportively with below. Given paper prescription, if she continues to have symptoms after 3 days, will fill.   Cough - Plan: benzonatate (TESSALON) 100 MG capsule, HYDROcodone-homatropine (HYCODAN) 5-1.5 MG/5ML syrup   Trena PlattStephanie English, PA-C [facility] 07/06/2016 9:16 AM

## 2016-07-06 NOTE — Progress Notes (Deleted)
[facility] [address], [place] [address] [phone]  Date:  07/06/2016   Name:  Katherine Sexton Dunkley   DOB:  18-Feb-1956   MRN:  [id number]  PCP:  Marga MelnickWilliam Hopper, MD    History of Present Illness:  Katherine Sexton [place] is a 60 y.o. female patient who [facility]   --1 week, worn out and fatigued --she has not taken anything --  Patient Active Problem List   Diagnosis Date Noted  . Upper respiratory infection 04/18/2015  . PERIMENOPAUSAL SYNDROME 03/23/2008  . HEADACHE 03/23/2008  . SUPRAVENTRICULAR TACHYCARDIA, PAROXYSMAL, HX OF 03/23/2008    Past Medical History:  Diagnosis Date  . Anxiety   . Migraines   . PSVT (paroxysmal supraventricular tachycardia) (HCC)   . Urinary tract infection     Past Surgical History:  Procedure Laterality Date  . ABDOMINAL HYSTERECTOMY     for prolapse  . TUBAL LIGATION      Social History  Substance Use Topics  . Smoking status: Never Smoker  . Smokeless tobacco: Never Used  . Alcohol use 0.0 oz/week     Comment: Occassionally    Family History  Problem Relation Age of Onset  . Hypertension Mother   . Osteoporosis Mother   . Colon polyps Mother   . Hypertension Father   . Heart disease Father     CABG  . Renal cancer Father   . Colon cancer Neg Hx   . Diabetes Neg Hx   . Esophageal cancer Neg Hx   . Gallbladder disease Neg Hx     No Known Allergies  Medication list has been reviewed and updated.  Current Outpatient Prescriptions on File Prior to Visit  Medication Sig Dispense Refill  . SUMAtriptan Succinate (IMITREX PO) Take 1 tablet by mouth as needed. migraine    . sucralfate (CARAFATE) 1 G tablet Take 1 tablet (1 g total) by mouth 4 (four) times daily -  with meals and at bedtime. (Patient not taking: Reported on 07/06/2016) 40 tablet 0   No current facility-administered medications on file prior to visit.     ROS   Physical Examination: BP 122/70 (BP Location: Right Arm, Patient Position:  Sitting, Cuff Size: Small)   Pulse 92   Temp 98.3 F (36.8 C) (Oral)   Resp 18   Ht 5' 9.75" (1.772 m)   Wt 167 lb (75.8 kg)   SpO2 98%   BMI 24.13 kg/m  Ideal Body Weight: Weight in (lb) to have BMI = 25: 172.6  Physical Exam   Assessment and Plan: Katherine Sexton [place] is a 60 y.o. female who is here today  There are no diagnoses linked to this encounter.  Trena PlattStephanie English, PA-C [facility] 07/06/2016 9:20 AM

## 2016-09-24 DIAGNOSIS — M25551 Pain in right hip: Secondary | ICD-10-CM | POA: Diagnosis not present

## 2016-09-24 DIAGNOSIS — G8929 Other chronic pain: Secondary | ICD-10-CM | POA: Diagnosis not present

## 2016-09-24 DIAGNOSIS — M7061 Trochanteric bursitis, right hip: Secondary | ICD-10-CM | POA: Diagnosis not present

## 2016-09-24 DIAGNOSIS — M25552 Pain in left hip: Secondary | ICD-10-CM | POA: Diagnosis not present

## 2016-09-24 DIAGNOSIS — M25572 Pain in left ankle and joints of left foot: Secondary | ICD-10-CM | POA: Diagnosis not present

## 2017-02-20 DIAGNOSIS — G43019 Migraine without aura, intractable, without status migrainosus: Secondary | ICD-10-CM | POA: Diagnosis not present

## 2017-02-23 DIAGNOSIS — R51 Headache: Secondary | ICD-10-CM | POA: Diagnosis not present

## 2017-04-25 DIAGNOSIS — H35371 Puckering of macula, right eye: Secondary | ICD-10-CM | POA: Diagnosis not present

## 2017-06-13 DIAGNOSIS — H35373 Puckering of macula, bilateral: Secondary | ICD-10-CM | POA: Diagnosis not present

## 2017-06-13 DIAGNOSIS — H25813 Combined forms of age-related cataract, bilateral: Secondary | ICD-10-CM | POA: Diagnosis not present

## 2017-06-13 DIAGNOSIS — H4321 Crystalline deposits in vitreous body, right eye: Secondary | ICD-10-CM | POA: Diagnosis not present

## 2017-07-21 DIAGNOSIS — R030 Elevated blood-pressure reading, without diagnosis of hypertension: Secondary | ICD-10-CM | POA: Diagnosis not present

## 2017-07-21 DIAGNOSIS — J069 Acute upper respiratory infection, unspecified: Secondary | ICD-10-CM | POA: Diagnosis not present

## 2017-09-24 DIAGNOSIS — M25551 Pain in right hip: Secondary | ICD-10-CM | POA: Diagnosis not present

## 2017-10-10 DIAGNOSIS — M25551 Pain in right hip: Secondary | ICD-10-CM | POA: Diagnosis not present

## 2017-10-22 ENCOUNTER — Ambulatory Visit: Payer: 59 | Admitting: Family Medicine

## 2017-10-22 ENCOUNTER — Encounter: Payer: Self-pay | Admitting: Family Medicine

## 2017-10-22 VITALS — BP 132/76 | HR 84 | Temp 98.4°F | Ht 69.75 in | Wt 173.2 lb

## 2017-10-22 DIAGNOSIS — F988 Other specified behavioral and emotional disorders with onset usually occurring in childhood and adolescence: Secondary | ICD-10-CM | POA: Insufficient documentation

## 2017-10-22 DIAGNOSIS — G43009 Migraine without aura, not intractable, without status migrainosus: Secondary | ICD-10-CM | POA: Diagnosis not present

## 2017-10-22 DIAGNOSIS — Z1159 Encounter for screening for other viral diseases: Secondary | ICD-10-CM

## 2017-10-22 DIAGNOSIS — Z114 Encounter for screening for human immunodeficiency virus [HIV]: Secondary | ICD-10-CM | POA: Diagnosis not present

## 2017-10-22 DIAGNOSIS — F900 Attention-deficit hyperactivity disorder, predominantly inattentive type: Secondary | ICD-10-CM | POA: Diagnosis not present

## 2017-10-22 DIAGNOSIS — Z13220 Encounter for screening for lipoid disorders: Secondary | ICD-10-CM

## 2017-10-22 DIAGNOSIS — G43909 Migraine, unspecified, not intractable, without status migrainosus: Secondary | ICD-10-CM | POA: Insufficient documentation

## 2017-10-22 DIAGNOSIS — Z0000 Encounter for general adult medical examination without abnormal findings: Secondary | ICD-10-CM

## 2017-10-22 DIAGNOSIS — Z1211 Encounter for screening for malignant neoplasm of colon: Secondary | ICD-10-CM | POA: Diagnosis not present

## 2017-10-22 NOTE — Progress Notes (Signed)
Phone: [phone]  Subjective:  Patient presents today to establish care.  Prior patient of Alwyn Ren. Chief complaint-noted.   See problem oriented charting  The following were reviewed and entered/updated in epic: Past Medical History:  Diagnosis Date  . Anxiety   . Chicken pox   . Hip bursitis    hip bursitis  . Migraines    Dr. Neale Burly of headache clinic. Started with Dr. Meryl Crutch years ago. Sumatriptan about 2x a week when stressed.   Marland Kitchen PSVT (paroxysmal supraventricular tachycardia) (HCC)    1993. provoked by atypical diet. no issues since then. dehydrated, wasnt caring for self  . Urinary tract infection    Patient Active Problem List   Diagnosis Date Noted  . ADD (attention deficit disorder) 10/22/2017    Priority: Medium  . Migraines     Priority: Medium  . History of PSVT (paroxysmal supraventricular tachycardia) 03/23/2008    Priority: Low   Past Surgical History:  Procedure Laterality Date  . ABDOMINAL HYSTERECTOMY     for prolapse  . BREAST ENHANCEMENT SURGERY Bilateral 01/2012  . TUBAL LIGATION      Family History  Problem Relation Age of Onset  . Hypertension Mother   . Osteoporosis Mother   . Colon polyps Mother        mid 80s. not sure what type  . COPD Mother        long term smoker  . Stroke Mother        14. TIA a few years prior  . Congestive Heart Failure Mother        8  . Hypertension Father   . Heart disease Father        CABG- mid 88s  . Renal cancer Father        early 64s  . Pulmonary fibrosis Father        lead to his death  . Healthy Sister   . Healthy Daughter   . Healthy Son   . Lung cancer Maternal Grandmother   . Heart disease Paternal Grandmother   . Heart attack Paternal Grandfather        died in 27s  . Colon cancer Neg Hx   . Diabetes Neg Hx   . Esophageal cancer Neg Hx   . Gallbladder disease Neg Hx     Medications- reviewed and updated Current Outpatient Medications  Medication Sig Dispense Refill  .  amphetamine-dextroamphetamine (ADDERALL) 20 MG tablet Take 2 tablets by mouth daily.  0  . SUMAtriptan Succinate (IMITREX PO) Take 1 tablet by mouth as needed. migraine     No current facility-administered medications for this visit.     Allergies-reviewed and updated No Known Allergies  Social History   Social History Narrative   Married. Lives with her husband. Husband John patient of Dr. Durene Cal.    Her daughter lives in [place], [place]. 2 children - Lindie Spruce and Blackard- she babysits them. Under age 7 in 2019   Her son lives in [place], [place]. Chrissie Noa and Gerilyn Pilgrim under age 62 in 2019      Homemaker   BS in nutrition [employer]      Hobbies: painting, time with kids and grandkids, reading, shopping, talking walks    ROS--Full ROS was completed Review of Systems  Constitutional: Negative for chills and fever.  HENT: Negative for ear discharge and hearing loss.   Eyes: Negative for blurred vision and double vision.  Respiratory: Negative for cough and hemoptysis.   Cardiovascular: Negative for chest pain  and palpitations.  Gastrointestinal: Negative for heartburn and nausea.  Genitourinary: Negative for dysuria and hematuria.  Musculoskeletal: Positive for joint pain (right hip pain). Negative for myalgias and neck pain.  Skin: Negative for itching and rash.  Neurological: Positive for headaches (migraine headaches). Negative for dizziness.  Endo/Heme/Allergies: Negative for polydipsia. Does not bruise/bleed easily.  Psychiatric/Behavioral: Negative for hallucinations and substance abuse.   Objective: BP 132/76 (BP Location: Left Arm, Patient Position: Sitting, Cuff Size: Large)   Pulse 84   Temp 98.4 F (36.9 C) (Oral)   Ht 5' 9.75" (1.772 m)   Wt 173 lb 3.2 oz (78.6 kg)   SpO2 96%   BMI 25.03 kg/m  Gen: NAD, resting comfortably HEENT: Mucous membranes are moist. Oropharynx normal. TM normal. Eyes: sclera and lids normal, PERRLA Neck: no thyromegaly, no cervical  lymphadenopathy CV: RRR no murmurs rubs or gallops Lungs: CTAB no crackles, wheeze, rhonchi Abdomen: soft/nontender/nondistended/normal bowel sounds. No rebound or guarding.  Ext: no edema Skin: warm, dry Neuro: 5/5 strength in upper and lower extremities, walks with limp from hip pain, normal reflexes Breast and GYN exam deferred to gynecology Dr. Ambrose Mantle  Assessment/Plan:  62 y.o. female presenting for annual physical along with establishing care  Health Maintenance counseling: 1. Anticipatory guidance: Patient counseled regarding regular dental exams- q6 months Dr. Lorelle Formosa, eye exams- Dr. Earlene Plater yearly, wearing seatbelts.  2. Risk factor reduction:  Advised patient of need for regular exercise and diet rich and fruits and vegetables to reduce risk of heart attack and stroke. Usually tries to exercise but has been set back by recent hip injury and in caring for kids- wants to work on picking up activity. reasonable diet- avoids added salt. Weakness is onion rings- really enjoys these. Overall not big meat eater- nice balanced fruits/veggies. We discussed trying to target weight 155-160 range- hs weight was 135 3. Immunizations/screenings/ancillary studies Immunization History  Administered Date(s) Administered  . Td 03/23/2008   Health Maintenance Due  Topic Date Due  . Hepatitis C Screening -completing today 10/22/2017 [birth year]  . HIV Screening -completing today 10/22/2017 05/08/1972  . PAP SMEAR -called [facility] to get records 05/08/1978  . COLONOSCOPY -We will call you within a week or two about your referral to GI. If you do not hear within 3 weeks, give [place] a call.   05/09/2007  . MAMMOGRAM -Provided [facility]out [facility]. Please please please call for this!  07/17/2015    4. Cervical cancer screening- Dr. Ambrose Mantle- will get records 5. Breast cancer screening-  breast exam with Dr. Ambrose Mantle and mammogram - has been years- strongly encouraged her to follow up with breast center 6.  Colon cancer screening - will refer today 7. Skin cancer screening- [facility] in the past  Other notes: 1. Working with Dr. Penni Bombard to figure out hip pain right side- limited movement as well. Looking at MR arthrogram. History bursitis in the past. This particular issue started after yoga class- feels like leg may give out. Upcoming hip injection as step to get athrogram approved 2. Mom colon polyps mid 75s- patient does agree to colonoscopy thankfully 3. Sees Dr. Evelene Croon for ADD treatment 4. Sees Dr. Neale Burly with headache clinic. Usually does well with sumatriptan twice a week  Future fasting labs Future Appointments  Date Time Provider Department Center  11/06/2017  8:15 AM [facility]   Lab/Order associations: Screen for colon cancer - Plan: Ambulatory referral to Gastroenterology  Migraine without aura and without status migrainosus, not  intractable - Plan: CBC, Comprehensive metabolic panel  Screening for HIV (human immunodeficiency virus) - Plan: HIV antibody  Encounter for HCV screening test for low risk patient - Plan: Hepatitis C antibody  Screening for hyperlipidemia - Plan: Lipid panel  Return precautions advised. Tana ConchStephen Hunter, MD

## 2017-10-22 NOTE — Patient Instructions (Addendum)
Health Maintenance Due  Topic Date Due  . Hepatitis C Screening -completing today 10/22/2017 September 14, 1955  . HIV Screening -completing today 10/22/2017 05/08/1972  . PAP SMEAR -called [facility] to get records 05/08/1978  . COLONOSCOPY -We will call you within a week or two about your referral to GI. If you do not hear within 3 weeks, give us a call.   05/09/2007  . MAMMOGRAM -Provided [facility]out [facility]. Please please please call for this!  07/17/2015   Once the hip gets in a better position- I want you to target 150 minutes exercise per week. So 30 minutes 5 days a week of 50 minutes 3 days a week. This is your exercise prescription. Find something you enjoy- whether walking, biking, gym- something sustainable.   Schedule a lab visit at the check out desk within 2 weeks. Return for future fasting labs meaning nothing but water after midnight please. Ok to take your medications with water.

## 2017-10-24 DIAGNOSIS — M25551 Pain in right hip: Secondary | ICD-10-CM | POA: Diagnosis not present

## 2017-10-25 DIAGNOSIS — M25551 Pain in right hip: Secondary | ICD-10-CM | POA: Diagnosis not present

## 2017-11-06 ENCOUNTER — Other Ambulatory Visit (INDEPENDENT_AMBULATORY_CARE_PROVIDER_SITE_OTHER): Payer: 59

## 2017-11-06 DIAGNOSIS — Z114 Encounter for screening for human immunodeficiency virus [HIV]: Secondary | ICD-10-CM

## 2017-11-06 DIAGNOSIS — Z13220 Encounter for screening for lipoid disorders: Secondary | ICD-10-CM | POA: Diagnosis not present

## 2017-11-06 DIAGNOSIS — Z1159 Encounter for screening for other viral diseases: Secondary | ICD-10-CM

## 2017-11-06 DIAGNOSIS — G43009 Migraine without aura, not intractable, without status migrainosus: Secondary | ICD-10-CM | POA: Diagnosis not present

## 2017-11-06 LAB — CBC
HCT: 40.3 % (ref 36.0–46.0)
HEMOGLOBIN: 13.6 g/dL (ref 12.0–15.0)
MCHC: 33.6 g/dL (ref 30.0–36.0)
MCV: 94.9 fl (ref 78.0–100.0)
Platelets: 415 10*3/uL — ABNORMAL HIGH (ref 150.0–400.0)
RBC: 4.25 Mil/uL (ref 3.87–5.11)
RDW: 13.8 % (ref 11.5–15.5)
WBC: 7.6 10*3/uL (ref 4.0–10.5)

## 2017-11-06 LAB — COMPREHENSIVE METABOLIC PANEL
ALBUMIN: 4.1 g/dL (ref 3.5–5.2)
ALT: 14 U/L (ref 0–35)
AST: 15 U/L (ref 0–37)
Alkaline Phosphatase: 61 U/L (ref 39–117)
BUN: 15 mg/dL (ref 6–23)
CALCIUM: 9.5 mg/dL (ref 8.4–10.5)
CHLORIDE: 103 meq/L (ref 96–112)
CO2: 27 mEq/L (ref 19–32)
Creatinine, Ser: 0.81 mg/dL (ref 0.40–1.20)
GFR: 76.53 mL/min (ref >60.00)
Glucose, Bld: 90 mg/dL (ref 70–99)
POTASSIUM: 4.7 meq/L (ref 3.5–5.1)
Sodium: 137 mEq/L (ref 135–145)
Total Bilirubin: 0.4 mg/dL (ref 0.2–1.2)
Total Protein: 7.2 g/dL (ref 6.0–8.3)

## 2017-11-06 LAB — LDL CHOLESTEROL, DIRECT: LDL DIRECT: 187 mg/dL

## 2017-11-06 LAB — LIPID PANEL
CHOLESTEROL: 302 mg/dL — AB (ref 0–200)
HDL: 69.8 mg/dL (ref >39.00)
NonHDL: 232.45
Total CHOL/HDL Ratio: 4
Triglycerides: 291 mg/dL — ABNORMAL HIGH (ref 0.0–149.0)
VLDL: 58.2 mg/dL — AB (ref 0.0–40.0)

## 2017-11-06 NOTE — Progress Notes (Signed)
HIV and hepatitis C are pending  your CBC was largely normal (blood counts, infection fighting cells, platelets).  Platelets were a hair high which is not a major concern.  Would be worth a 3692-month check in to see how you are progressing.   Your CMET was normal (kidney, liver, and electrolytes, blood sugar).  Your cholesterol levels are very elevated.  Bad cholesterol goal of 100 or less and you are at 187. I think we should focus on healthy lifestyle choices we already discussed and recheck next year.

## 2017-11-07 ENCOUNTER — Telehealth: Payer: Self-pay | Admitting: Family Medicine

## 2017-11-07 LAB — HEPATITIS C ANTIBODY
Hepatitis C Ab: NONREACTIVE
SIGNAL TO CUT-OFF: 0.01 (ref <1.00)

## 2017-11-07 LAB — HIV ANTIBODY (ROUTINE TESTING W REFLEX): HIV: NONREACTIVE

## 2017-11-07 NOTE — Telephone Encounter (Signed)
Copied from CRM [id number]. Topic: Quick Communication - Lab Results °>> Nov 07, 2017  9:10 AM [place] Hizer, Jamie M, LPN wrote: °Called patient to inform them of 11/06/2017 lab results. When patient returns call, triage nurse may disclose results. °

## 2017-11-07 NOTE — Telephone Encounter (Signed)
Copied from CRM [phone]. Topic: Quick Communication - Lab Results >> Nov 07, 2017  9:10 AM [name], [name], [place] wrote: Called patient to inform them of 11/06/2017 lab results. When patient returns call, triage nurse may disclose results.

## 2017-11-07 NOTE — Telephone Encounter (Signed)
Patient returning call, Call back  [phone]

## 2017-11-08 ENCOUNTER — Telehealth: Payer: Self-pay

## 2017-11-08 NOTE — Telephone Encounter (Signed)
See result note.  

## 2017-11-08 NOTE — Telephone Encounter (Signed)
Called patient and informed her the Hep C and HIV test were both negative. Patient Verbalized Understanding.

## 2017-11-19 DIAGNOSIS — M25551 Pain in right hip: Secondary | ICD-10-CM | POA: Diagnosis not present

## 2017-11-22 DIAGNOSIS — M25551 Pain in right hip: Secondary | ICD-10-CM | POA: Diagnosis not present

## 2017-11-22 DIAGNOSIS — M24159 Other articular cartilage disorders, unspecified hip: Secondary | ICD-10-CM | POA: Diagnosis not present

## 2017-12-05 DIAGNOSIS — M24159 Other articular cartilage disorders, unspecified hip: Secondary | ICD-10-CM | POA: Diagnosis not present

## 2017-12-05 DIAGNOSIS — M1611 Unilateral primary osteoarthritis, right hip: Secondary | ICD-10-CM | POA: Diagnosis not present

## 2017-12-05 DIAGNOSIS — M25551 Pain in right hip: Secondary | ICD-10-CM | POA: Diagnosis not present

## 2017-12-16 DIAGNOSIS — G43019 Migraine without aura, intractable, without status migrainosus: Secondary | ICD-10-CM | POA: Diagnosis not present

## 2017-12-24 ENCOUNTER — Encounter: Payer: Self-pay | Admitting: Family Medicine

## 2018-01-06 ENCOUNTER — Ambulatory Visit: Payer: 59 | Admitting: Family Medicine

## 2018-01-06 ENCOUNTER — Encounter: Payer: Self-pay | Admitting: Family Medicine

## 2018-01-06 DIAGNOSIS — E785 Hyperlipidemia, unspecified: Secondary | ICD-10-CM

## 2018-01-06 NOTE — Assessment & Plan Note (Signed)
S: poorly controlled on no statin with ascvd risk score of 3.5% for 10 year risk. Also has triglycerides over 200 which elevates risk. We had discussed lifestyle chagnes and she is already down 3 lbs since last visit.   Her regular walking has been down due to her hip pain.  Lab Results  Component Value Date   CHOL 302 (H) 11/06/2017   HDL 69.80 11/06/2017   LDLDIRECT 187.0 11/06/2017   TRIG 291.0 (H) 11/06/2017   CHOLHDL 4 11/06/2017   A/P: we had a long chat about her lipids and risk going into surgery. We considered starting statin but I told her with risk under 5% even with high #s that we can hold off and continue to focus on lifestyle changes. She is in agreement.

## 2018-01-06 NOTE — Patient Instructions (Addendum)
Health Maintenance Due  Topic Date Due  . [facility]  . COLONOSCOPY -will schedule after hip surgery 05/09/2007  . MAMMOGRAM -will have done after hip surgery 07/17/2015   Will send in clearance today. Continue efforts for healthy eating- already down 3 lbs- keep up the good work. As long as your 10 year risk of heart attack or stroke remains below 5% we will focus on lifestyle changes alone- above that point could consider medication- yours was 3.5% even with how high your numbers were.

## 2018-01-06 NOTE — Progress Notes (Signed)
Subjective:  Katherine Sexton is a 62 y.o. year old very pleasant female patient who presents for/with See problem oriented charting ROS- No chest pain or shortness of breath. No edema. No fever/chills reported.   Past Medical History-  Patient Active Problem List   Diagnosis Date Noted  . Hyperlipidemia 01/06/2018    Priority: Medium  . ADD (attention deficit disorder) 10/22/2017    Priority: Medium  . Migraines     Priority: Medium  . History of PSVT (paroxysmal supraventricular tachycardia) 03/23/2008    Priority: Low    Medications- reviewed and updated Current Outpatient Medications  Medication Sig Dispense Refill  . amphetamine-dextroamphetamine (ADDERALL) 20 MG tablet Take 2 tablets by mouth daily.  0  . SUMAtriptan Succinate (IMITREX PO) Take 1 tablet by mouth as needed. migraine     No current facility-administered medications for this visit.     Objective: BP 122/86 (BP Location: Left Arm, Patient Position: Sitting, Cuff Size: Large)   Pulse 87   Temp 98.1 F (36.7 C) (Oral)   Ht 5' 9.75" (1.772 m)   Wt 170 lb 9.6 oz (77.4 kg)   SpO2 96%   BMI 24.65 kg/m  Gen: NAD, resting comfortably CV: RRR no murmurs rubs or gallops Lungs: CTAB no crackles, wheeze, rhonchi Abdomen: soft/nontender/nondistended/healthy weight Ext: no edema Skin: warm, dry  Assessment/Plan:  Preoperative evaluation: right hip replacement upcoming. She is using aleve and still has still has severe pain. Anterior method plannedscheduled august 15th. Took 2 months for her to get her arthrogram which was needed.  Able to do 4 mets of activity without chest pain or shortness of breath. Can go up 14 stairs carrying a baby for example with no issues - sometimes back to back. Surgeon will be Dr. Lequita HaltAluisio.  -she has minimal risk factors- she is medically maximized for surgery- sent in form to [facility]  Hyperlipidemia S: poorly controlled on no statin with ascvd risk score of 3.5% for 10 year risk.  Also has triglycerides over 200 which elevates risk. We had discussed lifestyle chagnes and she is already down 3 lbs since last visit.   Her regular walking has been down due to her hip pain.  Lab Results  Component Value Date   CHOL 302 (H) 11/06/2017   HDL 69.80 11/06/2017   LDLDIRECT 187.0 11/06/2017   TRIG 291.0 (H) 11/06/2017   CHOLHDL 4 11/06/2017   A/P: we had a long chat about her lipids and risk going into surgery. We considered starting statin but I told her with risk under 5% even with high #s that we can hold off and continue to focus on lifestyle changes. She is in agreement.   Return precautions advised.  Katherine ConchStephen Hunter, MD

## 2018-01-07 ENCOUNTER — Ambulatory Visit: Payer: 59 | Admitting: Family Medicine

## 2018-02-06 NOTE — H&P (Signed)
TOTAL HIP ADMISSION H&P  Patient is admitted for right total hip arthroplasty.  Subjective:  Chief Complaint: right hip pain  HPI: Katherine Sexton, 62 y.o. female, has a history of pain and functional disability in the right hip(s) due to arthritis and patient has failed non-surgical conservative treatments for greater than 12 weeks to include NSAID's and/or analgesics, corticosteriod injections and activity modification.  Onset of symptoms was abrupt starting 1 year ago with gradually worsening course since that time.The patient noted no past surgery on the right hip(s).  Patient currently rates pain in the right hip at 7 out of 10 with activity. Patient has worsening of pain with activity and weight bearing and instability. Patient has evidence of coxa valga inclination and joint space narrowing of the right hip by imaging studies. This condition presents safety issues increasing the risk of falls. There is no current active infection.  Patient Active Problem List   Diagnosis Date Noted  . Hyperlipidemia 01/06/2018  . ADD (attention deficit disorder) 10/22/2017  . Migraines   . History of PSVT (paroxysmal supraventricular tachycardia) 03/23/2008   Past Medical History:  Diagnosis Date  . Anxiety   . Chicken pox   . Hip bursitis    hip bursitis  . Migraines    Dr. Neale BurlyFreeman of headache clinic. Started with Dr. Meryl CrutchAdelman years ago. Sumatriptan about 2x a week when stressed.   Marland Kitchen. PSVT (paroxysmal supraventricular tachycardia) (HCC)    1993. provoked by atypical diet. no issues since then. dehydrated, wasnt caring for self  . Urinary tract infection     Past Surgical History:  Procedure Laterality Date  . ABDOMINAL HYSTERECTOMY     for prolapse  . BREAST ENHANCEMENT SURGERY Bilateral 01/2012  . TUBAL LIGATION      No current facility-administered medications for this encounter.    Current Outpatient Medications  Medication Sig Dispense Refill Last Dose  . amphetamine-dextroamphetamine  (ADDERALL) 20 MG tablet Take 2 tablets by mouth daily.  0 Taking  . SUMAtriptan Succinate (IMITREX PO) Take 1 tablet by mouth as needed. migraine   Taking   No Known Allergies  Social History   Tobacco Use  . Smoking status: Never Smoker  . Smokeless tobacco: Never Used  Substance Use Topics  . Alcohol use: Yes    Alcohol/week: 1.8 - 2.4 oz    Types: 3 - 4 Glasses of wine per week    Comment: Occassionally    Family History  Problem Relation Age of Onset  . Hypertension Mother   . Osteoporosis Mother   . Colon polyps Mother        mid 7570s. not sure what type  . COPD Mother        long term smoker  . Stroke Mother        1889. TIA a few years prior  . Congestive Heart Failure Mother        1688  . Hypertension Father   . Heart disease Father        CABG- mid 960s  . Renal cancer Father        early 4670s  . Pulmonary fibrosis Father        lead to his death  . Healthy Sister   . Healthy Daughter   . Healthy Son   . Lung cancer Maternal Grandmother   . Heart disease Paternal Grandmother   . Heart attack Paternal Grandfather        died in 4450s  . Colon cancer Neg  Hx   . Diabetes Neg Hx   . Esophageal cancer Neg Hx   . Gallbladder disease Neg Hx      Review of Systems  Constitutional: Negative for chills and fever.  HENT: Negative for congestion, sore throat and tinnitus.   Eyes: Negative for double vision, photophobia and pain.  Respiratory: Negative for cough, shortness of breath and wheezing.   Cardiovascular: Negative for chest pain, palpitations and orthopnea.  Gastrointestinal: Negative for heartburn, nausea and vomiting.  Genitourinary: Negative for dysuria, frequency and urgency.  Musculoskeletal: Positive for joint pain.  Neurological: Negative for dizziness, weakness and headaches.  Psychiatric/Behavioral: Negative for depression.    Objective:  Physical Exam  Well nourished and well developed. General: Alert and oriented x3, cooperative and pleasant, no  acute distress. Head: normocephalic, atraumatic, neck supple. Eyes: EOMI. Respiratory: breath sounds clear in all fields, no wheezing, rales, or rhonchi. Cardiovascular: Regular rate and rhythm, no murmurs, gallops or rubs.  Abdomen: non-tender to palpation and soft, normoactive bowel sounds. Musculoskeletal: Significantly antalgic gait without the use of assisted devices.  Right Hip Exam: ROM: Flexion to 100 , Internal Rotation minimal, External Rotation 20, and abduction 20 without discomfort. There is no tenderness over the greater trochanter. There is no pain on provocative testing of the hip. Left Hip Exam: ROM: Flexion to 100, Internal Rotation minimal, External Rotation 20, and abduction 20 without discomfort. There is tenderness over the greater trochanter. She does have pain on provocative testing of the hip. Right Knee Exam: No effusion. Range of motion is 0-125 degrees. No crepitus on range of motion of the knee. No medial or lateral joint line tenderness. Stable knee. Calves soft and nontender. Motor function intact in LE. Strength 5/5 LE bilaterally. Neuro: Distal pulses 2+. Sensation to light touch intact in LE.  Vital signs in last 24 hours: Blood pressure: 130/84 mmHg Pulse: 84 bpm  Labs:   Estimated body mass index is 24.65 kg/m as calculated from the following:   Height as of 01/06/18: 5' 9.75" (1.772 m).   Weight as of 01/06/18: 77.4 kg (170 lb 9.6 oz).   Imaging Review Plain radiographs demonstrate severe degenerative joint disease of the right hip(s). The bone quality appears to be adequate for age and reported activity level.  Preoperative templating of the joint replacement has been completed, documented, and submitted to the Operating Room personnel in order to optimize intra-operative equipment management.   Assessment/Plan:  End stage arthritis, right hip(s)  The patient history, physical examination, clinical judgement of the provider and imaging studies  are consistent with end stage degenerative joint disease of the right hip(s) and total hip arthroplasty is deemed medically necessary. The treatment options including medical management, injection therapy, arthroscopy and arthroplasty were discussed at length. The risks and benefits of total hip arthroplasty were presented and reviewed. The risks due to aseptic loosening, infection, stiffness, dislocation/subluxation,  thromboembolic complications and other imponderables were discussed.  The patient acknowledged the explanation, agreed to proceed with the plan and consent was signed. Patient is being admitted for inpatient treatment for surgery, pain control, PT, OT, prophylactic antibiotics, VTE prophylaxis, progressive ambulation and ADL's and discharge planning.The patient is planning to be discharged home with home exercise program.    Therapy Plans: HEP Disposition: Home with husband and daughter Planned DVT Prophylaxis: aspirin 325 mg BID DME needed: walker PCP: Dr. Annice Pih (medical clearance provided) TXA: IV Allergies: None  - Patient was instructed on what medications to stop prior to surgery. -  Follow-up visit in 2 weeks with Dr. Wynelle Link - Begin physical therapy following surgery - Pre-operative lab work as pre-surgical testing - Prescriptions will be provided in hospital at time of discharge  Theresa Duty, PA-C Orthopedic Surgery [facility] [place]

## 2018-02-17 NOTE — Progress Notes (Addendum)
Surgical clearance Dr Tana ConchStephen Hunter on chart

## 2018-02-17 NOTE — Patient Instructions (Signed)
Katherine ShoeMary Sexton  02/17/2018   Your procedure is scheduled on: 02-26-18    [facility]   Report to admitting at 12:45PM    Call this number if you have problems the morning of surgery [phone]     Remember: Do not eat food After Midnight.YOU MAY HAVE CLEAR LIQUIDS FROM MIDNIGHT UNTIL 9:15AM. NOTHING BY MOUTH AFTER 9:15AM!     Take these medicines the morning of surgery with A SIP OF WATER: sumatriptan if needed                                 You may not have any metal on your body including hair pins and              piercings  Do not wear jewelry, make-up, lotions, powders or perfumes, deodorant             Do not wear nail polish.  Do not shave  48 hours prior to surgery.     Do not bring valuables to the hospital. [facility] IS NOT             RESPONSIBLE   FOR VALUABLES.  Contacts, dentures or bridgework may not be worn into surgery.  Leave suitcase in the car. After surgery it may be brought to your room.                   Please read over the following fact sheets you were given: _____________________________________________________________________             [facility] - Preparing for [facility] Before surgery, you can play an important role.  Because skin is not sterile, your skin needs to be as free of germs as possible.  You can reduce the number of germs on your skin by washing with CHG (chlorahexidine gluconate) soap before surgery.  CHG is an antiseptic cleaner which kills germs and bonds with the skin to continue killing germs even after washing. Please DO NOT use if you have an allergy to CHG or antibacterial soaps.  If your skin becomes reddened/irritated stop using the CHG and inform your nurse when you arrive at Short Stay. Do not shave (including legs and underarms) for at least 48 hours prior to the first CHG shower.  You may shave your face/neck. Please follow these instructions carefully:  1.  Shower with  CHG Soap the night before surgery and the  morning of Surgery.  2.  If you choose to wash your hair, wash your hair first as usual with your  normal  shampoo.  3.  After you shampoo, rinse your hair and body thoroughly to remove the  shampoo.                           4.  Use CHG as you would any other liquid soap.  You can apply chg directly  to the skin and wash                       Gently with a scrungie or clean washcloth.  5.  Apply the CHG Soap to your body ONLY FROM THE NECK DOWN.   Do not use on face/ open  Wound or open sores. Avoid contact with eyes, ears mouth and genitals (private parts).                       Wash face,  Genitals (private parts) with your normal soap.             6.  Wash thoroughly, paying special attention to the area where your surgery  will be performed.  7.  Thoroughly rinse your body with warm water from the neck down.  8.  DO NOT shower/wash with your normal soap after using and rinsing off  the CHG Soap.                9.  Pat yourself dry with a clean towel.            10.  Wear clean pajamas.            11.  Place clean sheets on your bed the night of your first shower and do not  sleep with pets. Day of Surgery : Do not apply any lotions/deodorants the morning of surgery.  Please wear clean clothes to the hospital/surgery center.  FAILURE TO FOLLOW THESE INSTRUCTIONS MAY RESULT IN THE CANCELLATION OF YOUR SURGERY PATIENT SIGNATURE_________________________________  NURSE SIGNATURE__________________________________  ________________________________________________________________________   Adam Phenix  An incentive spirometer is a tool that can help keep your lungs clear and active. This tool measures how well you are filling your lungs with each breath. Taking long deep breaths may help reverse or decrease the chance of developing breathing (pulmonary) problems (especially infection) following:  A long period of  time when you are unable to move or be active. BEFORE THE PROCEDURE   If the spirometer includes an indicator to show your best effort, your nurse or respiratory therapist will set it to a desired goal.  If possible, sit up straight or lean slightly forward. Try not to slouch.  Hold the incentive spirometer in an upright position. INSTRUCTIONS FOR USE  1. Sit on the edge of your bed if possible, or sit up as far as you can in bed or on a chair. 2. Hold the incentive spirometer in an upright position. 3. Breathe out normally. 4. Place the mouthpiece in your mouth and seal your lips tightly around it. 5. Breathe in slowly and as deeply as possible, raising the piston or the ball toward the top of the column. 6. Hold your breath for 3-5 seconds or for as long as possible. Allow the piston or ball to fall to the bottom of the column. 7. Remove the mouthpiece from your mouth and breathe out normally. 8. Rest for a few seconds and repeat Steps 1 through 7 at least 10 times every 1-2 hours when you are awake. Take your time and take a few normal breaths between deep breaths. 9. The spirometer may include an indicator to show your best effort. Use the indicator as a goal to work toward during each repetition. 10. After each set of 10 deep breaths, practice coughing to be sure your lungs are clear. If you have an incision (the cut made at the time of surgery), support your incision when coughing by placing a pillow or rolled up towels firmly against it. Once you are able to get out of bed, walk around indoors and cough well. You may stop using the incentive spirometer when instructed by your caregiver.  RISKS AND COMPLICATIONS  Take your time so you do not get  dizzy or light-headed.  If you are in pain, you may need to take or ask for pain medication before doing incentive spirometry. It is harder to take a deep breath if you are having pain. AFTER USE  Rest and breathe slowly and easily.  It can  be helpful to keep track of a log of your progress. Your caregiver can provide you with a simple table to help with this. If you are using the spirometer at home, follow these instructions: [place] IF:   You are having difficultly using the spirometer.  You have trouble using the spirometer as often as instructed.  Your pain medication is not giving enough relief while using the spirometer.  You develop fever of 100.5 F (38.1 C) or higher. SEEK IMMEDIATE MEDICAL CARE IF:   You cough up bloody sputum that had not been present before.  You develop fever of 102 F (38.9 C) or greater.  You develop worsening pain at or near the incision site. MAKE SURE YOU:   Understand these instructions.  Will watch your condition.  Will get help right away if you are not doing well or get worse. Document Released: 11/12/2006 Document Revised: 09/24/2011 Document Reviewed: 01/13/2007 ExitCare Patient Information 2014 Exit[employer], [place].   ________________________________________________________________________  WHAT IS A BLOOD TRANSFUSION? Blood Transfusion Information  A transfusion is the replacement of blood or some of its parts. Blood is made up of multiple cells which provide different functions.  Red blood cells carry oxygen and are used for blood loss replacement.  White blood cells fight against infection.  Platelets control bleeding.  Plasma helps clot blood.  Other blood products are available for specialized needs, such as hemophilia or other clotting disorders. BEFORE THE TRANSFUSION  Who gives blood for transfusions?   Healthy volunteers who are fully evaluated to make sure their blood is safe. This is blood bank blood. Transfusion therapy is the safest it has ever been in the practice of medicine. Before blood is taken from a donor, a complete history is taken to make sure that person has no history of diseases nor engages in risky social behavior (examples are  intravenous drug use or sexual activity with multiple partners). The donor's travel history is screened to minimize risk of transmitting infections, such as malaria. The donated blood is tested for signs of infectious diseases, such as HIV and hepatitis. The blood is then tested to be sure it is compatible with you in order to minimize the chance of a transfusion reaction. If you or a relative donates blood, this is often done in anticipation of surgery and is not appropriate for emergency situations. It takes many days to process the donated blood. RISKS AND COMPLICATIONS Although transfusion therapy is very safe and saves many lives, the main dangers of transfusion include:   Getting an infectious disease.  Developing a transfusion reaction. This is an allergic reaction to something in the blood you were given. Every precaution is taken to prevent this. The decision to have a blood transfusion has been considered carefully by your caregiver before blood is given. Blood is not given unless the benefits outweigh the risks. AFTER THE TRANSFUSION  Right after receiving a blood transfusion, you will usually feel much better and more energetic. This is especially true if your red blood cells have gotten low (anemic). The transfusion raises the level of the red blood cells which carry oxygen, and this usually causes an energy increase.  The nurse administering the transfusion will  monitor you carefully for complications. HOME CARE INSTRUCTIONS  No special instructions are needed after a transfusion. You may find your energy is better. Speak with your caregiver about any limitations on activity for underlying diseases you may have. SEEK MEDICAL CARE IF:   Your condition is not improving after your transfusion.  You develop redness or irritation at the intravenous (IV) site. SEEK IMMEDIATE MEDICAL CARE IF:  Any of the following symptoms occur over the next 12 hours:  Shaking chills.  You have a  temperature by mouth above 102 F (38.9 C), not controlled by medicine.  Chest, back, or muscle pain.  People around you feel you are not acting correctly or are confused.  Shortness of breath or difficulty breathing.  Dizziness and fainting.  You get a rash or develop hives.  You have a decrease in urine output.  Your urine turns a dark color or changes to pink, red, or brown. Any of the following symptoms occur over the next 10 days:  You have a temperature by mouth above 102 F (38.9 C), not controlled by medicine.  Shortness of breath.  Weakness after normal activity.  The white part of the eye turns yellow (jaundice).  You have a decrease in the amount of urine or are urinating less often.  Your urine turns a dark color or changes to pink, red, or brown. Document Released: 06/29/2000 Document Revised: 09/24/2011 Document Reviewed: 02/16/2008 [facility] Patient Information 2014 [place], [place].  _______________________________________________________________________

## 2018-02-18 ENCOUNTER — Encounter (HOSPITAL_COMMUNITY)
Admission: RE | Admit: 2018-02-18 | Discharge: 2018-02-18 | Disposition: A | Discharge status: Home / Self Care | Payer: 59 | Source: Ambulatory Visit | Attending: Orthopedic Surgery | Admitting: Orthopedic Surgery

## 2018-02-18 ENCOUNTER — Other Ambulatory Visit: Payer: Self-pay

## 2018-02-18 ENCOUNTER — Encounter (HOSPITAL_COMMUNITY): Payer: Self-pay

## 2018-02-18 DIAGNOSIS — Z01812 Encounter for preprocedural laboratory examination: Secondary | ICD-10-CM | POA: Insufficient documentation

## 2018-02-18 LAB — COMPREHENSIVE METABOLIC PANEL
ALT: 16 U/L (ref 0–44)
AST: 19 U/L (ref 15–41)
Albumin: 4 g/dL (ref 3.5–5.0)
Alkaline Phosphatase: 50 U/L (ref 38–126)
Anion gap: 8 (ref 5–15)
BILIRUBIN TOTAL: 0.5 mg/dL (ref 0.3–1.2)
BUN: 13 mg/dL (ref 6–20)
CO2: 29 mmol/L (ref 22–32)
Calcium: 9.5 mg/dL (ref 8.9–10.3)
Chloride: 104 mmol/L (ref 98–111)
Creatinine, Ser: 0.75 mg/dL (ref 0.44–1.00)
GFR calc non Af Amer: >60 mL/min (ref >60)
Glucose, Bld: 97 mg/dL (ref 70–99)
Potassium: 4.2 mmol/L (ref 3.5–5.1)
Sodium: 141 mmol/L (ref 135–145)
Total Protein: 7.2 g/dL (ref 6.5–8.1)

## 2018-02-18 LAB — CBC
HEMATOCRIT: 40 % (ref 36.0–46.0)
Hemoglobin: 12.8 g/dL (ref 12.0–15.0)
MCH: 31.2 pg (ref 26.0–34.0)
MCHC: 32 g/dL (ref 30.0–36.0)
MCV: 97.6 fL (ref 78.0–100.0)
PLATELETS: 452 10*3/uL — AB (ref 150–400)
RBC: 4.1 MIL/uL (ref 3.87–5.11)
RDW: 14.2 % (ref 11.5–15.5)
WBC: 7.2 10*3/uL (ref 4.0–10.5)

## 2018-02-18 LAB — URINALYSIS, ROUTINE W REFLEX MICROSCOPIC
Bilirubin Urine: NEGATIVE
Glucose, UA: NEGATIVE mg/dL
KETONES UR: NEGATIVE mg/dL
Nitrite: POSITIVE — AB
PH: 5 (ref 5.0–8.0)
PROTEIN: NEGATIVE mg/dL
Specific Gravity, Urine: 1.018 (ref 1.005–1.030)

## 2018-02-18 LAB — ABO/RH: ABO/RH(D): A POS

## 2018-02-18 LAB — SURGICAL PCR SCREEN
MRSA, PCR: NEGATIVE
STAPHYLOCOCCUS AUREUS: NEGATIVE

## 2018-02-18 NOTE — Progress Notes (Signed)
Urinalysis routed via epic to Dr Lequita HaltAluisio

## 2018-02-19 ENCOUNTER — Ambulatory Visit: Payer: 59 | Admitting: Family Medicine

## 2018-02-19 ENCOUNTER — Encounter: Payer: Self-pay | Admitting: Family Medicine

## 2018-02-19 VITALS — BP 124/82 | HR 78 | Temp 98.0°F | Ht 68.0 in | Wt 172.6 lb

## 2018-02-19 DIAGNOSIS — H00015 Hordeolum externum left lower eyelid: Secondary | ICD-10-CM | POA: Diagnosis not present

## 2018-02-19 MED ORDER — ERYTHROMYCIN 5 MG/GM OP OINT: TOPICAL_OINTMENT | OPHTHALMIC | 0 refills | Status: DC

## 2018-02-19 NOTE — Patient Instructions (Addendum)
Health Maintenance Due  Topic Date Due  . COLONOSCOPY - Patient is holding off until procedure is done 05/09/2007  . MAMMOGRAM - Patient holding off until procedure is done. 07/17/2015  . PAP SMEAR - team please request a copy of this from Dr. Ambrose MantleHenley 02/14/2016  . INFLUENZA VACCINE - Patient will wait until Fall  02/13/2018    Stye A stye is a bump on your eyelid caused by a bacterial infection. A stye can form inside the eyelid (internal stye) or outside the eyelid (external stye). An internal stye may be caused by an infected oil-producing gland inside your eyelid. An external stye may be caused by an infection at the base of your eyelash (hair follicle). Styes are very common. Anyone can get them at any age. They usually occur in just one eye, but you may have more than one in either eye. What are the causes? The infection is almost always caused by bacteria called Staphylococcus aureus. This is a common type of bacteria that lives on your skin. What increases the risk? You may be at higher risk for a stye if you have had one before. You may also be at higher risk if you have:  Diabetes.  Long-term illness.  Long-term eye redness.  A skin condition called seborrhea.  High fat levels in your blood (lipids).  What are the signs or symptoms? Eyelid pain is the most common symptom of a stye. Internal styes are more painful than external styes. Other signs and symptoms may include:  Painful swelling of your eyelid.  A scratchy feeling in your eye.  Tearing and redness of your eye.  Pus draining from the stye.  How is this diagnosed? Your health care provider may be able to diagnose a stye just by examining your eye. The health care provider may also check to make sure:  You do not have a fever or other signs of a more serious infection.  The infection has not spread to other parts of your eye or areas around your eye.  How is this treated? Most styes will clear up in a few  days without treatment. In some cases, you may need to use antibiotic drops or ointment to prevent infection. Your health care provider may have to drain the stye surgically if your stye is:  Large.  Causing a lot of pain.  Interfering with your vision.  This can be done using a thin blade or a needle. Follow these instructions at home:  Take medicines only as directed by your health care provider.  Apply a clean, warm compress to your eye for 10 minutes, 4 times a day.  Do not wear contact lenses or eye makeup until your stye has healed.  Do not try to pop or drain the stye. Contact a health care provider if:  You have chills or a fever.  Your stye does not go away after several days.  Your stye affects your vision.  Your eyeball becomes swollen, red, or painful. This information is not intended to replace advice given to you by your health care provider. Make sure you discuss any questions you have with your health care provider. Document Released: 04/11/2005 Document Revised: 02/26/2016 Document Reviewed: 10/16/2013 Elsevier Interactive Patient Education  [employer]2018 [employer]

## 2018-02-19 NOTE — Progress Notes (Signed)
Subjective:  Katherine Sexton is a 62 y.o. year old very pleasant female patient who presents for/with See problem oriented charting  ROS-no fevers, chills, fatigue/malaise, nausea/vomiting, or recent weight change   Past Medical History-  Patient Active Problem List   Diagnosis Date Noted  . Hyperlipidemia 01/06/2018    Priority: Medium  . ADD (attention deficit disorder) 10/22/2017    Priority: Medium  . Migraines     Priority: Medium  . History of PSVT (paroxysmal supraventricular tachycardia) 03/23/2008    Priority: Low    Medications- reviewed and updated Current Outpatient Medications  Medication Sig Dispense Refill  . amphetamine-dextroamphetamine (ADDERALL) 20 MG tablet Take 20 mg by mouth 2 (two) times daily.   0  . erythromycin ophthalmic ointment Apply small amount to stye 4x a day for up to 5-7 days. Continue warm compresses 4x a day as well 3.5 g 0  . IRON PO Take 1 tablet by mouth daily.    Marland Kitchen. MAGNESIUM PO Take 1 tablet by mouth daily.    . Multiple Vitamin (MULTIVITAMIN WITH MINERALS) TABS tablet Take 1 tablet by mouth daily.    . SUMAtriptan (IMITREX) 100 MG tablet Take 100 mg by mouth every 2 (two) hours as needed for migraine. May repeat in 2 hours if headache persists or recurs.     No current facility-administered medications for this visit.     Objective: BP 124/82 (BP Location: Left Arm, Patient Position: Sitting, Cuff Size: Normal)   Pulse 78   Temp 98 F (36.7 C) (Oral)   Ht 5\' 8"  (1.727 m)   Wt 172 lb 9.6 oz (78.3 kg)   SpO2 96%   BMI 26.24 kg/m  Gen: NAD, resting comfortably Left lower eyelid with raised 2-3 mm lesion, under conjunctiva small whitehead noted. Right eye normal  CV: RRR no murmurs rubs or gallops Lungs: CTAB no crackles, wheeze, rhonchi Ext: no edema Skin: warm, dry, mild erythema around lesion on eyelid- some tenderness  Assessment/Plan:  Hordeolum externum of left lower eyelid S: stye started this weekend - has been doing warm  compresses twice a day. Seems to be coming to head. Mild discomfort worse with palpation. She has surgery next week and during preop visit she was asked to have this addressed prior to surgery A/P: we discussed mainstay of treatment is warm compresses and conservative care. GI[facility] surgical office sent her here we did opt to be more aggressive and treat with erythromycin out of an abundance of precaution   Meds ordered this encounter  Medications  . erythromycin ophthalmic ointment    Sig: Apply small amount to stye 4x a day for up to 5-7 days. Continue warm compresses 4x a day as well    Dispense:  3.5 g    Refill:  0    Return precautions advised.  Tana ConchStephen Hunter, MD

## 2018-02-26 ENCOUNTER — Encounter (HOSPITAL_COMMUNITY)
Admission: RE | Disposition: A | Discharge status: Home / Self Care | Payer: Self-pay | Source: Ambulatory Visit | Attending: Orthopedic Surgery

## 2018-02-26 ENCOUNTER — Inpatient Hospital Stay (HOSPITAL_COMMUNITY): Payer: 59 | Admitting: Certified Registered"

## 2018-02-26 ENCOUNTER — Inpatient Hospital Stay (HOSPITAL_COMMUNITY): Payer: 59

## 2018-02-26 ENCOUNTER — Encounter (HOSPITAL_COMMUNITY): Payer: Self-pay

## 2018-02-26 ENCOUNTER — Inpatient Hospital Stay (HOSPITAL_COMMUNITY)
Admission: RE | Admit: 2018-02-26 | Discharge: 2018-02-28 | DRG: 470 | Disposition: A | Discharge status: Home / Self Care | Payer: 59 | Attending: Orthopedic Surgery | Admitting: Orthopedic Surgery

## 2018-02-26 ENCOUNTER — Other Ambulatory Visit: Payer: Self-pay

## 2018-02-26 DIAGNOSIS — Z90710 Acquired absence of both cervix and uterus: Secondary | ICD-10-CM | POA: Diagnosis not present

## 2018-02-26 DIAGNOSIS — I739 Peripheral vascular disease, unspecified: Secondary | ICD-10-CM | POA: Diagnosis present

## 2018-02-26 DIAGNOSIS — M25551 Pain in right hip: Secondary | ICD-10-CM | POA: Diagnosis not present

## 2018-02-26 DIAGNOSIS — E785 Hyperlipidemia, unspecified: Secondary | ICD-10-CM | POA: Diagnosis present

## 2018-02-26 DIAGNOSIS — Z471 Aftercare following joint replacement surgery: Secondary | ICD-10-CM | POA: Diagnosis not present

## 2018-02-26 DIAGNOSIS — F419 Anxiety disorder, unspecified: Secondary | ICD-10-CM | POA: Diagnosis present

## 2018-02-26 DIAGNOSIS — M169 Osteoarthritis of hip, unspecified: Secondary | ICD-10-CM | POA: Diagnosis present

## 2018-02-26 DIAGNOSIS — Z96649 Presence of unspecified artificial hip joint: Secondary | ICD-10-CM

## 2018-02-26 DIAGNOSIS — M1611 Unilateral primary osteoarthritis, right hip: Secondary | ICD-10-CM | POA: Diagnosis not present

## 2018-02-26 DIAGNOSIS — Z96641 Presence of right artificial hip joint: Secondary | ICD-10-CM | POA: Diagnosis not present

## 2018-02-26 HISTORY — PX: TOTAL HIP ARTHROPLASTY: SHX124

## 2018-02-26 LAB — TYPE AND SCREEN
ABO/RH(D): A POS
Antibody Screen: NEGATIVE

## 2018-02-26 LAB — PROTIME-INR
INR: 1.1
Prothrombin Time: 14.2 seconds (ref 11.4–15.2)

## 2018-02-26 LAB — APTT: aPTT: 32 seconds (ref 24–36)

## 2018-02-26 SURGERY — ARTHROPLASTY, HIP, TOTAL, ANTERIOR APPROACH
Anesthesia: Spinal | Site: Hip | Laterality: Right

## 2018-02-26 MED ORDER — HYDROCODONE-ACETAMINOPHEN 5-325 MG PO TABS
1.0000 | ORAL_TABLET | ORAL | Status: DC | PRN
  Administered 2018-02-27 – 2018-02-28 (×3): 2 via ORAL
  Filled 2018-02-26 (×3): qty 2

## 2018-02-26 MED ORDER — PROPOFOL 500 MG/50ML IV EMUL
INTRAVENOUS | Status: DC | PRN
  Administered 2018-02-26: 25 ug/kg/min via INTRAVENOUS

## 2018-02-26 MED ORDER — ONDANSETRON HCL 4 MG/2ML IJ SOLN
INTRAMUSCULAR | Status: DC | PRN
  Administered 2018-02-26: 4 mg via INTRAVENOUS

## 2018-02-26 MED ORDER — MENTHOL 3 MG MT LOZG: 1.0000 | LOZENGE | OROMUCOSAL | Status: DC | PRN

## 2018-02-26 MED ORDER — LIDOCAINE 2% (20 MG/ML) 5 ML SYRINGE
INTRAMUSCULAR | Status: DC | PRN
  Administered 2018-02-26: 40 mg via INTRAVENOUS

## 2018-02-26 MED ORDER — DEXAMETHASONE SODIUM PHOSPHATE 10 MG/ML IJ SOLN
8.0000 mg | Freq: Once | INTRAMUSCULAR | Status: AC
  Administered 2018-02-26: 8 mg via INTRAVENOUS

## 2018-02-26 MED ORDER — FENTANYL CITRATE (PF) 100 MCG/2ML IJ SOLN
INTRAMUSCULAR | Status: AC
  Filled 2018-02-26: qty 2

## 2018-02-26 MED ORDER — ONDANSETRON HCL 4 MG PO TABS: 4.0000 mg | ORAL_TABLET | Freq: Four times a day (QID) | ORAL | Status: DC | PRN

## 2018-02-26 MED ORDER — CHLORHEXIDINE GLUCONATE 4 % EX LIQD: 60.0000 mL | Freq: Once | CUTANEOUS | Status: DC

## 2018-02-26 MED ORDER — BISACODYL 10 MG RE SUPP: 10.0000 mg | Freq: Every day | RECTAL | Status: DC | PRN

## 2018-02-26 MED ORDER — AMPHETAMINE-DEXTROAMPHETAMINE 20 MG PO TABS
20.0000 mg | ORAL_TABLET | Freq: Two times a day (BID) | ORAL | Status: DC
  Administered 2018-02-28: 20 mg via ORAL
  Filled 2018-02-26 (×2): qty 1

## 2018-02-26 MED ORDER — FENTANYL CITRATE (PF) 100 MCG/2ML IJ SOLN
25.0000 ug | INTRAMUSCULAR | Status: DC | PRN
  Administered 2018-02-26: 50 ug via INTRAVENOUS

## 2018-02-26 MED ORDER — EPHEDRINE SULFATE-NACL 50-0.9 MG/10ML-% IV SOSY
PREFILLED_SYRINGE | INTRAVENOUS | Status: DC | PRN
  Administered 2018-02-26 (×8): 5 mg via INTRAVENOUS

## 2018-02-26 MED ORDER — PHENYLEPHRINE 40 MCG/ML (10ML) SYRINGE FOR IV PUSH (FOR BLOOD PRESSURE SUPPORT)
PREFILLED_SYRINGE | INTRAVENOUS | Status: DC | PRN
  Administered 2018-02-26: 120 ug via INTRAVENOUS
  Administered 2018-02-26: 80 ug via INTRAVENOUS
  Administered 2018-02-26 (×4): 120 ug via INTRAVENOUS
  Administered 2018-02-26: 80 ug via INTRAVENOUS
  Administered 2018-02-26 (×3): 120 ug via INTRAVENOUS

## 2018-02-26 MED ORDER — TRANEXAMIC ACID 1000 MG/10ML IV SOLN
INTRAVENOUS | Status: AC
  Filled 2018-02-26: qty 10

## 2018-02-26 MED ORDER — MIDAZOLAM HCL 2 MG/2ML IJ SOLN
INTRAMUSCULAR | Status: AC
  Filled 2018-02-26: qty 2

## 2018-02-26 MED ORDER — BUPIVACAINE-EPINEPHRINE (PF) 0.25% -1:200000 IJ SOLN
INTRAMUSCULAR | Status: DC | PRN
  Administered 2018-02-26: 30 mL

## 2018-02-26 MED ORDER — TRAMADOL HCL 50 MG PO TABS: 50.0000 mg | ORAL_TABLET | Freq: Four times a day (QID) | ORAL | Status: DC | PRN

## 2018-02-26 MED ORDER — BUPIVACAINE IN DEXTROSE 0.75-8.25 % IT SOLN
INTRATHECAL | Status: DC | PRN
  Administered 2018-02-26: 1.8 mL via INTRATHECAL

## 2018-02-26 MED ORDER — SODIUM CHLORIDE 0.9 % IV SOLN
INTRAVENOUS | Status: DC | PRN
  Administered 2018-02-26: 20 ug/min via INTRAVENOUS

## 2018-02-26 MED ORDER — ONDANSETRON HCL 4 MG/2ML IJ SOLN: 4.0000 mg | Freq: Four times a day (QID) | INTRAMUSCULAR | Status: DC | PRN

## 2018-02-26 MED ORDER — HYDROCODONE-ACETAMINOPHEN 7.5-325 MG PO TABS
1.0000 | ORAL_TABLET | ORAL | Status: DC | PRN
  Administered 2018-02-26 (×2): 1 via ORAL
  Administered 2018-02-27 (×3): 2 via ORAL
  Filled 2018-02-26 (×3): qty 2
  Filled 2018-02-26 (×2): qty 1
  Filled 2018-02-26: qty 2

## 2018-02-26 MED ORDER — LACTATED RINGERS IV SOLN
INTRAVENOUS | Status: DC
  Administered 2018-02-26: 13:00:00 via INTRAVENOUS

## 2018-02-26 MED ORDER — METOCLOPRAMIDE HCL 5 MG PO TABS: 5.0000 mg | ORAL_TABLET | Freq: Three times a day (TID) | ORAL | Status: DC | PRN

## 2018-02-26 MED ORDER — SODIUM CHLORIDE 0.9 % IR SOLN
Status: DC | PRN
  Administered 2018-02-26: 1000 mL

## 2018-02-26 MED ORDER — MEPERIDINE HCL 50 MG/ML IJ SOLN
6.2500 mg | INTRAMUSCULAR | Status: DC | PRN
Start: 2018-02-26 — End: 2018-02-26

## 2018-02-26 MED ORDER — STERILE WATER FOR IRRIGATION IR SOLN
Status: DC | PRN
  Administered 2018-02-26: 2000 mL

## 2018-02-26 MED ORDER — ASPIRIN EC 325 MG PO TBEC
325.0000 mg | DELAYED_RELEASE_TABLET | Freq: Two times a day (BID) | ORAL | Status: DC
  Administered 2018-02-27 – 2018-02-28 (×3): 325 mg via ORAL
  Filled 2018-02-26 (×3): qty 1

## 2018-02-26 MED ORDER — BUPIVACAINE-EPINEPHRINE (PF) 0.25% -1:200000 IJ SOLN
INTRAMUSCULAR | Status: AC
  Filled 2018-02-26: qty 30

## 2018-02-26 MED ORDER — TRAMADOL HCL 50 MG PO TABS
50.0000 mg | ORAL_TABLET | Freq: Four times a day (QID) | ORAL | Status: DC | PRN
  Administered 2018-02-26 – 2018-02-27 (×2): 50 mg via ORAL
  Filled 2018-02-26 (×2): qty 1

## 2018-02-26 MED ORDER — METHOCARBAMOL 1000 MG/10ML IJ SOLN
500.0000 mg | Freq: Four times a day (QID) | INTRAVENOUS | Status: DC | PRN
  Filled 2018-02-26: qty 5

## 2018-02-26 MED ORDER — TRANEXAMIC ACID 1000 MG/10ML IV SOLN
1000.0000 mg | Freq: Once | INTRAVENOUS | Status: AC
  Administered 2018-02-26: 1000 mg via INTRAVENOUS
  Filled 2018-02-26: qty 1000

## 2018-02-26 MED ORDER — ACETAMINOPHEN 10 MG/ML IV SOLN
1000.0000 mg | Freq: Four times a day (QID) | INTRAVENOUS | Status: DC
  Administered 2018-02-26: 1000 mg via INTRAVENOUS

## 2018-02-26 MED ORDER — SODIUM CHLORIDE 0.9 % IV SOLN
INTRAVENOUS | Status: DC
  Administered 2018-02-26: 21:00:00 via INTRAVENOUS

## 2018-02-26 MED ORDER — CEFAZOLIN SODIUM-DEXTROSE 2-4 GM/100ML-% IV SOLN
2.0000 g | Freq: Four times a day (QID) | INTRAVENOUS | Status: AC
Start: 2018-02-26 — End: 2018-02-27
  Administered 2018-02-26 – 2018-02-27 (×2): 2 g via INTRAVENOUS
  Filled 2018-02-26 (×2): qty 100

## 2018-02-26 MED ORDER — DIPHENHYDRAMINE HCL 12.5 MG/5ML PO ELIX: 12.5000 mg | ORAL_SOLUTION | ORAL | Status: DC | PRN

## 2018-02-26 MED ORDER — POLYETHYLENE GLYCOL 3350 17 G PO PACK: 17.0000 g | PACK | Freq: Every day | ORAL | Status: DC | PRN

## 2018-02-26 MED ORDER — PROPOFOL 10 MG/ML IV BOLUS
INTRAVENOUS | Status: DC | PRN
  Administered 2018-02-26: 20 mg via INTRAVENOUS
  Administered 2018-02-26: 10 mg via INTRAVENOUS
  Administered 2018-02-26: 20 mg via INTRAVENOUS
  Administered 2018-02-26: 50 mg via INTRAVENOUS
  Administered 2018-02-26: 20 mg via INTRAVENOUS
  Administered 2018-02-26: 50 mg via INTRAVENOUS
  Administered 2018-02-26 (×2): 20 mg via INTRAVENOUS

## 2018-02-26 MED ORDER — PROPOFOL 10 MG/ML IV BOLUS
INTRAVENOUS | Status: AC
  Filled 2018-02-26: qty 40

## 2018-02-26 MED ORDER — ACETAMINOPHEN 500 MG PO TABS
500.0000 mg | ORAL_TABLET | Freq: Four times a day (QID) | ORAL | Status: AC
  Administered 2018-02-26 – 2018-02-27 (×3): 500 mg via ORAL
  Filled 2018-02-26 (×5): qty 1

## 2018-02-26 MED ORDER — FLEET ENEMA 7-19 GM/118ML RE ENEM: 1.0000 | ENEMA | Freq: Once | RECTAL | Status: DC | PRN

## 2018-02-26 MED ORDER — PROPOFOL 10 MG/ML IV BOLUS
INTRAVENOUS | Status: AC
  Filled 2018-02-26: qty 20

## 2018-02-26 MED ORDER — PHENOL 1.4 % MT LIQD
1.0000 | OROMUCOSAL | Status: DC | PRN
  Filled 2018-02-26: qty 177

## 2018-02-26 MED ORDER — METHOCARBAMOL 500 MG PO TABS
500.0000 mg | ORAL_TABLET | Freq: Four times a day (QID) | ORAL | Status: DC | PRN
  Administered 2018-02-26 – 2018-02-28 (×4): 500 mg via ORAL
  Filled 2018-02-26 (×4): qty 1

## 2018-02-26 MED ORDER — DEXAMETHASONE SODIUM PHOSPHATE 10 MG/ML IJ SOLN
10.0000 mg | Freq: Once | INTRAMUSCULAR | Status: AC
  Administered 2018-02-27: 10 mg via INTRAVENOUS
  Filled 2018-02-26: qty 1

## 2018-02-26 MED ORDER — SUMATRIPTAN SUCCINATE 50 MG PO TABS
100.0000 mg | ORAL_TABLET | ORAL | Status: DC | PRN
  Filled 2018-02-26: qty 2

## 2018-02-26 MED ORDER — FENTANYL CITRATE (PF) 100 MCG/2ML IJ SOLN
INTRAMUSCULAR | Status: DC | PRN
  Administered 2018-02-26 (×2): 25 ug via INTRAVENOUS

## 2018-02-26 MED ORDER — DOCUSATE SODIUM 100 MG PO CAPS
100.0000 mg | ORAL_CAPSULE | Freq: Two times a day (BID) | ORAL | Status: DC
  Administered 2018-02-26 – 2018-02-28 (×4): 100 mg via ORAL
  Filled 2018-02-26 (×5): qty 1

## 2018-02-26 MED ORDER — ACETAMINOPHEN 10 MG/ML IV SOLN
INTRAVENOUS | Status: AC
  Filled 2018-02-26: qty 100

## 2018-02-26 MED ORDER — METOCLOPRAMIDE HCL 5 MG/ML IJ SOLN: 5.0000 mg | Freq: Three times a day (TID) | INTRAMUSCULAR | Status: DC | PRN

## 2018-02-26 MED ORDER — PROPOFOL 10 MG/ML IV BOLUS
INTRAVENOUS | Status: AC
Start: 2018-02-26 — End: ?
  Filled 2018-02-26: qty 20

## 2018-02-26 MED ORDER — TRANEXAMIC ACID 1000 MG/10ML IV SOLN
1000.0000 mg | INTRAVENOUS | Status: AC
  Administered 2018-02-26: 1000 mg via INTRAVENOUS

## 2018-02-26 MED ORDER — CEFAZOLIN SODIUM-DEXTROSE 2-4 GM/100ML-% IV SOLN
2.0000 g | INTRAVENOUS | Status: AC
  Administered 2018-02-26: 2 g via INTRAVENOUS

## 2018-02-26 MED ORDER — MIDAZOLAM HCL 5 MG/5ML IJ SOLN
INTRAMUSCULAR | Status: DC | PRN
  Administered 2018-02-26 (×4): 1 mg via INTRAVENOUS

## 2018-02-26 MED ORDER — CEFAZOLIN SODIUM-DEXTROSE 2-4 GM/100ML-% IV SOLN
INTRAVENOUS | Status: AC
  Filled 2018-02-26: qty 100

## 2018-02-26 MED ORDER — PROMETHAZINE HCL 25 MG/ML IJ SOLN: 6.2500 mg | INTRAMUSCULAR | Status: DC | PRN

## 2018-02-26 MED ORDER — MORPHINE SULFATE (PF) 2 MG/ML IV SOLN: 0.5000 mg | INTRAVENOUS | Status: DC | PRN

## 2018-02-26 SURGICAL SUPPLY — 40 items
BAG DECANTER FOR FLEXI CONT (MISCELLANEOUS) ×2 IMPLANT
BAG SPEC THK2 15X12 ZIP CLS (MISCELLANEOUS)
BAG ZIPLOCK 12X15 (MISCELLANEOUS) IMPLANT
BLADE SAG 18X100X1.27 (BLADE) ×2 IMPLANT
COVER PERINEAL POST (MISCELLANEOUS) ×2 IMPLANT
COVER SURGICAL LIGHT HANDLE (MISCELLANEOUS) ×2 IMPLANT
CUP ACETBLR 52 OD PINNACLE (Hips) ×1 IMPLANT
DECANTER SPIKE VIAL GLASS SM (MISCELLANEOUS) ×2 IMPLANT
DRAPE STERI IOBAN 125X83 (DRAPES) ×2 IMPLANT
DRAPE U-SHAPE 47X51 STRL (DRAPES) ×4 IMPLANT
DRSG ADAPTIC 3X8 NADH LF (GAUZE/BANDAGES/DRESSINGS) ×2 IMPLANT
DRSG MEPILEX BORDER 4X4 (GAUZE/BANDAGES/DRESSINGS) ×2 IMPLANT
DRSG MEPILEX BORDER 4X8 (GAUZE/BANDAGES/DRESSINGS) ×2 IMPLANT
DURAPREP 26ML APPLICATOR (WOUND CARE) ×2 IMPLANT
ELECT REM PT RETURN 15FT ADLT (MISCELLANEOUS) ×2 IMPLANT
EVACUATOR 1/8 PVC DRAIN (DRAIN) ×2 IMPLANT
GLOVE BIO SURGEON STRL SZ7 (GLOVE) ×2 IMPLANT
GLOVE BIO SURGEON STRL SZ8 (GLOVE) ×2 IMPLANT
GLOVE BIOGEL PI IND STRL 7.0 (GLOVE) ×1 IMPLANT
GLOVE BIOGEL PI IND STRL 8 (GLOVE) ×1 IMPLANT
GLOVE BIOGEL PI INDICATOR 7.0 (GLOVE) ×5
GLOVE BIOGEL PI INDICATOR 8 (GLOVE) ×1
GOWN STRL REUS W/TWL LRG LVL3 (GOWN DISPOSABLE) ×2 IMPLANT
GOWN STRL REUS W/TWL XL LVL3 (GOWN DISPOSABLE) ×2 IMPLANT
HEAD FEMORAL 32 CERAMIC (Hips) ×1 IMPLANT
HOLDER FOLEY CATH W/STRAP (MISCELLANEOUS) ×2 IMPLANT
LINER MARATHON NEUT +4X52X32 (Hips) ×1 IMPLANT
MANIFOLD NEPTUNE II (INSTRUMENTS) ×2 IMPLANT
PACK ANTERIOR HIP CUSTOM (KITS) ×2 IMPLANT
STEM FEMORAL SZ 5MM STD ACTIS (Stem) ×1 IMPLANT
STRIP CLOSURE SKIN 1/2X4 (GAUZE/BANDAGES/DRESSINGS) ×2 IMPLANT
SUT ETHIBOND NAB CT1 #1 30IN (SUTURE) ×2 IMPLANT
SUT MNCRL AB 4-0 PS2 18 (SUTURE) ×2 IMPLANT
SUT STRATAFIX 0 PDS 27 VIOLET (SUTURE) ×2
SUT VIC AB 2-0 CT1 27 (SUTURE) ×4
SUT VIC AB 2-0 CT1 TAPERPNT 27 (SUTURE) ×2 IMPLANT
SUTURE STRATFX 0 PDS 27 VIOLET (SUTURE) ×1 IMPLANT
SYR 50ML LL SCALE MARK (SYRINGE) IMPLANT
TRAY FOLEY CATH 14FR (SET/KITS/TRAYS/PACK) ×1 IMPLANT
YANKAUER SUCT BULB TIP 10FT TU (MISCELLANEOUS) ×2 IMPLANT

## 2018-02-26 NOTE — Interval H&P Note (Signed)
History and Physical Interval Note:  02/26/2018 1:31 PM  Katherine Sexton  has presented today for surgery, with the diagnosis of Osteoarthritis Right hip  The various methods of treatment have been discussed with the patient and family. After consideration of risks, benefits and other options for treatment, the patient has consented to  Procedure(s): RIGHT TOTAL HIP ARTHROPLASTY ANTERIOR APPROACH (Right) as a surgical intervention .  The patient's history has been reviewed, patient examined, no change in status, stable for surgery.  I have reviewed the patient's chart and labs.  Questions were answered to the patient's satisfaction.     Homero FellersFrank Gussie Murton

## 2018-02-26 NOTE — Plan of Care (Signed)
°  Problem: Clinical Measurements: °Goal: Ability to maintain clinical measurements within normal limits will improve °Outcome: Progressing °  °Problem: Clinical Measurements: °Goal: Will remain free from infection °Outcome: Progressing °  °Problem: Clinical Measurements: °Goal: Diagnostic test results will improve °Outcome: Progressing °  °Problem: Clinical Measurements: °Goal: Respiratory complications will improve °Outcome: Progressing °  °Problem: Clinical Measurements: °Goal: Cardiovascular complication will be avoided °Outcome: Progressing °  °Problem: Health Behavior/Discharge Planning: °Goal: Ability to manage health-related needs will improve °Outcome: Progressing °  °

## 2018-02-26 NOTE — Anesthesia Postprocedure Evaluation (Signed)
Anesthesia Post Note  Patient: Katherine Sexton  Procedure(s) Performed: RIGHT TOTAL HIP ARTHROPLASTY ANTERIOR APPROACH (Right Hip)     Patient location during evaluation: PACU Anesthesia Type: Spinal Level of consciousness: awake and alert Pain management: pain level controlled Vital Signs Assessment: post-procedure vital signs reviewed and stable Respiratory status: spontaneous breathing Cardiovascular status: stable Postop Assessment: spinal receding Anesthetic complications: no    Last Vitals:  Vitals:   02/26/18 1900 02/26/18 1956  BP: 130/81 (!) 118/93  Pulse: 62 76  Resp: 18 18  Temp: (!) 36.4 C 36.5 C  SpO2: 100% 100%    Last Pain:  Vitals:   02/26/18 2013  TempSrc:   PainSc: 5                  Lewie LoronJohn Nely Dedmon

## 2018-02-26 NOTE — Transfer of Care (Signed)
Immediate Anesthesia Transfer of Care Note  Patient: Katherine Sexton  Procedure(s) Performed: RIGHT TOTAL HIP ARTHROPLASTY ANTERIOR APPROACH (Right Hip)  Patient Location: PACU  Anesthesia Type:Spinal  Level of Consciousness: awake, alert , oriented and patient cooperative  Airway & Oxygen Therapy: Patient Spontanous Breathing and Patient connected to face mask oxygen  Post-op Assessment: Report given to RN and Post -op Vital signs reviewed and stable  Post vital signs: Reviewed and stable  Last Vitals:  Vitals Value Taken Time  BP 99/67 02/26/2018  5:18 PM  Temp    Pulse 69 02/26/2018  5:20 PM  Resp 20 02/26/2018  5:20 PM  SpO2 100 % 02/26/2018  5:20 PM  Vitals shown include unvalidated device data.  Last Pain:  Vitals:   02/26/18 1302  TempSrc: Oral  PainSc: 4          Complications: No apparent anesthesia complications

## 2018-02-26 NOTE — Op Note (Signed)
OPERATIVE REPORT- TOTAL HIP ARTHROPLASTY   PREOPERATIVE DIAGNOSIS: Osteoarthritis of the Right hip.   POSTOPERATIVE DIAGNOSIS: Osteoarthritis of the Right  hip.   PROCEDURE: Right total hip arthroplasty, anterior approach.   SURGEON: Ollen GrossFrank Aunesty Tyson, MD   ASSISTANT: Arther AbbottKristie Edmisten, PA-C  ANESTHESIA:  Spinal  ESTIMATED BLOOD LOSS:- 450 ml  DRAINS: Hemovac x1.   COMPLICATIONS: None   CONDITION: PACU - hemodynamically stable.   BRIEF CLINICAL NOTE: Katherine ShoeMary Sexton is a 62 y.o. female who has advanced end-  stage arthritis of their Right  hip with progressively worsening pain and  dysfunction.The patient has failed nonoperative management and presents for  total hip arthroplasty.   PROCEDURE IN DETAIL: After successful administration of spinal  anesthetic, the traction boots [facility] were [facility] on both  feet and the [facility] was [facility], boots placed into the leg  holders. The Right hip was then isolated from the perineum with plastic  drapes and prepped and draped in the usual sterile fashion. ASIS and  greater trochanter were marked and a oblique incision was made, starting  at about 1 cm lateral and 2 cm distal to the ASIS and coursing towards  the anterior cortex of the femur. The skin was cut with a 10 blade  through subcutaneous tissue to the level of the fascia overlying the  tensor fascia lata muscle. The fascia was then incised in line with the  incision at the junction of the anterior third and posterior 2/3rd. The  muscle was teased off the fascia and then the interval between the TFL  and the rectus was developed. The Hohmann retractor was then placed at  the top of the femoral neck over the capsule. The vessels overlying the  capsule were cauterized and the fat on top of the capsule was removed.  A Hohmann retractor was then placed anterior underneath the rectus  femoris to give exposure to the entire anterior capsule. A T-shaped   capsulotomy was performed. The edges were tagged and the femoral head  was identified.       Osteophytes are removed off the superior acetabulum.  The femoral neck was then cut in situ with an oscillating saw. Traction  was then applied to the left lower extremity utilizing the [facility]  traction. The femoral head was then removed. Retractors were placed  around the acetabulum and then circumferential removal of the labrum was  performed. Osteophytes were also removed. Reaming starts at 47 mm to  medialize and  Increased in 2 mm increments to 51 mm. We reamed in  approximately 40 degrees of abduction, 20 degrees anteversion. A 52 mm  pinnacle acetabular shell was then impacted in anatomic position under  fluoroscopic guidance with excellent purchase. We did not need to place  any additional dome screws. A 32 mm neutral + 4 marathon liner was then  placed into the acetabular shell.       The femoral lift was then placed along the lateral aspect of the femur  just distal to the vastus ridge. The leg was  externally rotated and capsule  was stripped off the inferior aspect of the femoral neck down to the  level of the lesser trochanter, this was done with electrocautery. The femur was lifted after this was performed. The  leg was then placed in an extended and adducted position essentially delivering the femur. We also removed the capsule superiorly and the piriformis from the piriformis fossa to  gain excellent exposure of the  proximal femur. Rongeur was used to remove some cancellous bone to get  into the lateral portion of the proximal femur for placement of the  initial starter reamer. The starter broaches was placed  the starter broach  and was shown to go down the center of the canal. Broaching  with the Actis system was then performed starting at size 0  coursing  Up to size 5. A size 5 had excellent torsional and rotational  and axial stability. The trial standard offset neck was then  placed  with a 32 + 1 trial head. The hip was then reduced. We confirmed that  the stem was in the canal both on AP and lateral x-rays. It also has excellent sizing. The hip was reduced with outstanding stability through full extension and full external rotation.. AP pelvis was taken and the leg lengths were measured and found to be equal. Hip was then dislocated again and the femoral head and neck removed. The  femoral broach was removed. Size 5 Actis stem with a standard offset  neck was then impacted into the femur following native anteversion. Has  excellent purchase in the canal. Excellent torsional and rotational and  axial stability. It is confirmed to be in the canal on AP and lateral  fluoroscopic views. The 32 + 1 ceramic head was placed and the hip  reduced with outstanding stability. Again AP pelvis was taken and it  confirmed that the leg lengths were equal. The wound was then copiously  irrigated with saline solution and the capsule reattached and repaired  with Ethibond suture. 30 ml of .25% Bupivicaine was  injected into the capsule and into the edge of the tensor fascia lata as well as subcutaneous tissue. The fascia overlying the tensor fascia lata was then closed with a running #1 V-Loc. Subcu was closed with interrupted 2-0 Vicryl and subcuticular running 4-0 Monocryl. Incision was cleaned  and dried. Steri-Strips and a bulky sterile dressing applied. Hemovac  drain was hooked to suction and then the patient was awakened and transported to  recovery in stable condition.        Please note that a surgical assistant was a medical necessity for this procedure to perform it in a safe and expeditious manner. Assistant was necessary to provide appropriate retraction of vital neurovascular structures and to prevent femoral fracture and allow for anatomic placement of the prosthesis.  Ollen GrossFrank Mayan Kloepfer, M.D.

## 2018-02-26 NOTE — Anesthesia Preprocedure Evaluation (Signed)
Anesthesia Evaluation  Patient identified by MRN, date of birth, ID band Patient awake    Reviewed: Allergy & Precautions, NPO status , Patient's Chart, lab work & pertinent test results  Airway Mallampati: II  TM Distance: >3 FB Neck ROM: Full    Dental no notable dental hx.    Pulmonary neg pulmonary ROS,    Pulmonary exam normal breath sounds clear to auscultation       Cardiovascular + Peripheral Vascular Disease  Normal cardiovascular exam Rhythm:Regular Rate:Normal     Neuro/Psych  Headaches, PSYCHIATRIC DISORDERS Anxiety    GI/Hepatic negative GI ROS, Neg liver ROS,   Endo/Other  negative endocrine ROS  Renal/GU negative Renal ROS     Musculoskeletal  (+) Arthritis ,   Abdominal   Peds  Hematology negative hematology ROS (+)   Anesthesia Other Findings   Reproductive/Obstetrics negative OB ROS                             Anesthesia Physical Anesthesia Plan  ASA: II  Anesthesia Plan: Spinal   Post-op Pain Management:    Induction: Intravenous  PONV Risk Score and Plan: 3 and Ondansetron, Dexamethasone and Treatment may vary due to age or medical condition  Airway Management Planned:   Additional Equipment:   Intra-op Plan:   Post-operative Plan:   Informed Consent: I have reviewed the patients History and Physical, chart, labs and discussed the procedure including the risks, benefits and alternatives for the proposed anesthesia with the patient or authorized representative who has indicated his/her understanding and acceptance.   Dental advisory given  Plan Discussed with: CRNA  Anesthesia Plan Comments:        Anesthesia Quick Evaluation

## 2018-02-26 NOTE — Anesthesia Procedure Notes (Signed)
Spinal  Patient location during procedure: OR Staffing Anesthesiologist: Nolon Nations, MD Performed: anesthesiologist  Preanesthetic Checklist Completed: patient identified, site marked, surgical consent, pre-op evaluation, timeout performed, IV checked, risks and benefits discussed and monitors and equipment checked Spinal Block Patient position: sitting Prep: ChloraPrep and site prepped and draped Patient monitoring: heart rate, continuous pulse ox and blood pressure Approach: right paramedian Location: L2-3 Injection technique: single-shot Needle Needle type: Sprotte  Needle gauge: 24 G Needle length: 9 cm Additional Notes Expiration date of kit checked and confirmed. Patient tolerated procedure well, without complications.

## 2018-02-27 ENCOUNTER — Encounter (HOSPITAL_COMMUNITY): Payer: Self-pay | Admitting: Orthopedic Surgery

## 2018-02-27 LAB — BASIC METABOLIC PANEL
ANION GAP: 10 (ref 5–15)
BUN: 10 mg/dL (ref 6–20)
CALCIUM: 8.3 mg/dL — AB (ref 8.9–10.3)
CO2: 22 mmol/L (ref 22–32)
Chloride: 104 mmol/L (ref 98–111)
Creatinine, Ser: 0.67 mg/dL (ref 0.44–1.00)
Glucose, Bld: 170 mg/dL — ABNORMAL HIGH (ref 70–99)
Potassium: 4.2 mmol/L (ref 3.5–5.1)
Sodium: 136 mmol/L (ref 135–145)

## 2018-02-27 LAB — CBC
HEMATOCRIT: 33.3 % — AB (ref 36.0–46.0)
Hemoglobin: 10.7 g/dL — ABNORMAL LOW (ref 12.0–15.0)
MCH: 31.3 pg (ref 26.0–34.0)
MCHC: 32.1 g/dL (ref 30.0–36.0)
MCV: 97.4 fL (ref 78.0–100.0)
PLATELETS: 363 10*3/uL (ref 150–400)
RBC: 3.42 MIL/uL — ABNORMAL LOW (ref 3.87–5.11)
RDW: 14.1 % (ref 11.5–15.5)
WBC: 14.1 10*3/uL — AB (ref 4.0–10.5)

## 2018-02-27 NOTE — Discharge Instructions (Signed)
°Dr. Frank Aluisio °Total Joint Specialist °[facility] °[address] °[place], [place] [address] °[phone] ° °ANTERIOR APPROACH TOTAL HIP REPLACEMENT POSTOPERATIVE DIRECTIONS ° ° °Hip Rehabilitation, Guidelines Following Surgery  °The results of a hip operation are greatly improved after range of motion and muscle strengthening exercises. Follow all safety measures which are given to protect your hip. If any of these exercises cause increased pain or swelling in your joint, decrease the amount until you are comfortable again. Then slowly increase the exercises. Call your caregiver if you have problems or questions.  ° °HOME CARE INSTRUCTIONS  °• Remove items at home which could result in a fall. This includes throw rugs or furniture in walking pathways.  °· ICE to the affected hip every three hours for 30 minutes at a time and then as needed for pain and swelling.  Continue to use ice on the hip for pain and swelling from surgery. You may notice swelling that will progress down to the foot and ankle.  This is normal after surgery.  Elevate the leg when you are not up walking on it.   °· Continue to use the breathing machine which will help keep your temperature down.  It is common for your temperature to cycle up and down following surgery, especially at night when you are not up moving around and exerting yourself.  The breathing machine keeps your lungs expanded and your temperature down. ° °DIET °You may resume your previous home diet once your are discharged from the hospital. ° °DRESSING / WOUND CARE / SHOWERING °You may shower 3 days after surgery, but keep the wounds dry during showering.  You may use an occlusive plastic wrap (Press'n Seal for example), NO SOAKING/SUBMERGING IN THE BATHTUB.  If the bandage gets wet, change with a clean dry gauze.  If the incision gets wet, pat the wound dry with a clean towel. °You may start showering once you are discharged home but do not submerge the  incision under water. Just pat the incision dry and apply a dry gauze dressing on daily. °Change the surgical dressing daily and reapply a dry dressing each time. ° °ACTIVITY °Walk with your walker as instructed. °Use walker as long as suggested by your caregivers. °Avoid periods of inactivity such as sitting longer than an hour when not asleep. This helps prevent blood clots.  °You may resume a sexual relationship in one month or when given the OK by your doctor.  °You may return to work once you are cleared by your doctor.  °Do not drive a car for 6 weeks or until released by you surgeon.  °Do not drive while taking narcotics. ° °WEIGHT BEARING °Weight bearing as tolerated with assist device (walker, cane, etc) as directed, use it as long as suggested by your surgeon or therapist, typically at least 4-6 weeks. ° °POSTOPERATIVE CONSTIPATION PROTOCOL °Constipation - defined medically as fewer than three stools per week and severe constipation as less than one stool per week. ° °One of the most common issues patients have following surgery is constipation.  Even if you have a regular bowel pattern at home, your normal regimen is likely to be disrupted due to multiple reasons following surgery.  Combination of anesthesia, postoperative narcotics, change in appetite and fluid intake all can affect your bowels.  In order to avoid complications following surgery, here are some recommendations in order to help you during your recovery period. ° °Colace (docusate) - Pick up an over-the-counter form   of Colace or another stool softener and take twice a day as long as you are requiring postoperative pain medications.  Take with a full glass of water daily.  If you experience loose stools or diarrhea, hold the colace until you stool forms back up.  If your symptoms do not get better within 1 week or if they get worse, check with your doctor. ° °Dulcolax (bisacodyl) - Pick up over-the-counter and take as directed by the product  packaging as needed to assist with the movement of your bowels.  Take with a full glass of water.  Use this product as needed if not relieved by Colace only.  ° °MiraLax (polyethylene glycol) - Pick up over-the-counter to have on hand.  MiraLax is a solution that will increase the amount of water in your bowels to assist with bowel movements.  Take as directed and can mix with a glass of water, juice, soda, coffee, or tea.  Take if you go more than two days without a movement. °Do not use MiraLax more than once per day. Call your doctor if you are still constipated or irregular after using this medication for 7 days in a row. ° °If you continue to have problems with postoperative constipation, please contact the office for further assistance and recommendations.  If you experience "the worst abdominal pain ever" or develop nausea or vomiting, please contact the office immediatly for further recommendations for treatment. ° °ITCHING ° If you experience itching with your medications, try taking only a single pain pill, or even half a pain pill at a time.  You can also use Benadryl over the counter for itching or also to help with sleep.  ° °TED HOSE STOCKINGS °Wear the elastic stockings on both legs for three weeks following surgery during the day but you may remove then at night for sleeping. ° °MEDICATIONS °See your medication summary on the “After Visit Summary” that the nursing staff will review with you prior to discharge.  You may have some home medications which will be placed on hold until you complete the course of blood thinner medication.  It is important for you to complete the blood thinner medication as prescribed by your surgeon.  Continue your approved medications as instructed at time of discharge. ° °PRECAUTIONS °If you experience chest pain or shortness of breath - call 911 immediately for transfer to the hospital emergency department.  °If you develop a fever greater that 101 F, purulent drainage  from wound, increased redness or drainage from wound, foul odor from the wound/dressing, or calf pain - CONTACT YOUR SURGEON.   °                                                °FOLLOW-UP APPOINTMENTS °Make sure you keep all of your appointments after your operation with your surgeon and caregivers. You should call the office at the above phone number and make an appointment for approximately two weeks after the date of your surgery or on the date instructed by your surgeon outlined in the "After Visit Summary". ° °RANGE OF MOTION AND STRENGTHENING EXERCISES  °These exercises are designed to help you keep full movement of your hip joint. Follow your caregiver's or physical therapist's instructions. Perform all exercises about fifteen times, three times per day or as directed. Exercise both hips, even if you have   had only one joint replacement. These exercises can be done on a training (exercise) mat, on the floor, on a table or on a bed. Use whatever works the best and is most comfortable for you. Use music or television while you are exercising so that the exercises are a pleasant break in your day. This will make your life better with the exercises acting as a break in routine you can look forward to.  °• Lying on your back, slowly slide your foot toward your buttocks, raising your knee up off the floor. Then slowly slide your foot back down until your leg is straight again.  °• Lying on your back spread your legs as far apart as you can without causing discomfort.  °• Lying on your side, raise your upper leg and foot straight up from the floor as far as is comfortable. Slowly lower the leg and repeat.  °• Lying on your back, tighten up the muscle in the front of your thigh (quadriceps muscles). You can do this by keeping your leg straight and trying to raise your heel off the floor. This helps strengthen the largest muscle supporting your knee.  °• Lying on your back, tighten up the muscles of your buttocks both  with the legs straight and with the knee bent at a comfortable angle while keeping your heel on the floor.  ° °IF YOU ARE TRANSFERRED TO A SKILLED REHAB FACILITY °If the patient is transferred to a skilled rehab facility following release from the hospital, a list of the current medications will be sent to the facility for the patient to continue.  When discharged from the skilled rehab facility, please have the facility set up the patient's Home Health Physical Therapy prior to being released. Also, the skilled facility will be responsible for providing the patient with their medications at time of release from the facility to include their pain medication, the muscle relaxants, and their blood thinner medication. If the patient is still at the rehab facility at time of the two week follow up appointment, the skilled rehab facility will also need to assist the patient in arranging follow up appointment in our office and any transportation needs. ° °MAKE SURE YOU:  °• Understand these instructions.  °• Get help right away if you are not doing well or get worse.  ° ° °Pick up stool softner and laxative for home use following surgery while on pain medications. °Do not submerge incision under water. °Please use good hand washing techniques while changing dressing each day. °May shower starting three days after surgery. °Please use a clean towel to pat the incision dry following showers. °Continue to use ice for pain and swelling after surgery. °Do not use any lotions or creams on the incision until instructed by your surgeon. ° °

## 2018-02-27 NOTE — Progress Notes (Signed)
   Subjective: 1 Day Post-Op Procedure(s) (LRB): RIGHT TOTAL HIP ARTHROPLASTY ANTERIOR APPROACH (Right) Patient reports pain as mild.   Patient seen in rounds with Dr. Lequita HaltAluisio. Patient is well, and has had no acute complaints or problems other than pain in the right hip. Denies shortness of breath or chest pain. Foley catheter to be removed this AM.  We will start therapy today.   Objective: Vital signs in last 24 hours: Temp:  [97.5 F (36.4 C)-98.1 F (36.7 C)] 97.9 F (36.6 C) (08/15 0526) Pulse Rate:  [60-109] 65 (08/15 0526) Resp:  [13-21] 18 (08/15 0526) BP: (99-163)/(61-110) 121/76 (08/15 0526) SpO2:  [96 %-100 %] 100 % (08/15 0526) Weight:  [78.5 kg] 78.5 kg (08/14 1850)  Intake/Output from previous day:  Intake/Output Summary (Last 24 hours) at 02/27/2018 0804 Last data filed at 02/27/2018 0600 Gross per 24 hour  Intake 1219.84 ml  Output 1965 ml  Net -745.16 ml     Labs: Recent Labs    02/27/18 0548  HGB 10.7*   Recent Labs    02/27/18 0548  WBC 14.1*  RBC 3.42*  HCT 33.3*  PLT 363   Recent Labs    02/27/18 0548  NA 136  K 4.2  CL 104  CO2 22  BUN 10  CREATININE 0.67  GLUCOSE 170*  CALCIUM 8.3*   Recent Labs    02/26/18 1330  INR 1.10    Exam: General - Patient is Alert and Oriented Extremity - Neurologically intact Neurovascular intact Sensation intact distally Dorsiflexion/Plantar flexion intact Dressing - dressing C/D/I Motor Function - intact, moving foot and toes well on exam.   Past Medical History:  Diagnosis Date  . Anxiety   . Chicken pox    childhood   . Hip bursitis    hip bursitis  . Migraines    Dr. Neale BurlyFreeman of headache clinic. Started with Dr. Meryl CrutchAdelman years ago. Sumatriptan about 2x a week when stressed.   Marland Kitchen. PSVT (paroxysmal supraventricular tachycardia) (HCC)    1993. provoked by atypical diet. no issues since then. dehydrated, wasnt caring for self  . Urinary tract infection     Assessment/Plan: 1 Day  Post-Op Procedure(s) (LRB): RIGHT TOTAL HIP ARTHROPLASTY ANTERIOR APPROACH (Right) Principal Problem:   OA (osteoarthritis) of hip  Estimated body mass index is 26.31 kg/m as calculated from the following:   Height as of this encounter: 5\' 8"  (1.727 m).   Weight as of this encounter: 78.5 kg. Advance diet Up with therapy  DVT Prophylaxis - Aspirin Weight bearing as tolerated. D/C O2 and pulse ox and try on room air. Hemovac pulled without difficulty, will begin therapy.  Plan is to go Home after hospital stay. Plan for discharge tomorrow with home exercise program if meeting goals.   Arther AbbottKristie Daryon Remmert, PA-C Orthopedic Surgery 02/27/2018, 8:04 AM

## 2018-02-27 NOTE — Evaluation (Signed)
Physical Therapy Evaluation Patient Details Name: Katherine Sexton MRN: [id number] DOB: [birth year] Today's Date: 02/27/2018   History of Present Illness  62 yo S/P R direct anterior THA  Clinical Impression  The patient exspresses that the pain level has escalated. RN notified. Patient did ambulate to the BR and back. Pt admitted with above diagnosis. Pt currently with functional limitations due to the deficits listed below (see PT Problem List).  Pt will benefit from skilled PT to increase their independence and safety with mobility to allow discharge to the venue listed below.       Follow Up Recommendations Follow surgeon's recommendation for DC plan and follow-up therapies    Equipment Recommendations  None recommended by PT    Recommendations for Other Services   OT    Precautions / Restrictions Precautions Precautions: Fall      Mobility  Bed Mobility Overal bed mobility: Needs Assistance Bed Mobility: Supine to Sit     Supine to sit: Min assist;HOB elevated     General bed mobility comments: assist with right leg and trunk  Transfers Overall transfer level: Needs assistance Equipment used: Rolling walker (2 wheeled) Transfers: Sit to/from Stand Sit to Stand: Min assist         General transfer comment: cues for hand and right leg position, assist to place right leg forward  Ambulation/Gait Ambulation/Gait assistance: Min assist Gait Distance (Feet): 15 Feet  X 2 Assistive device: Rolling walker (2 wheeled) Gait Pattern/deviations: Step-to pattern;Antalgic;Decreased step length - right;Decreased stance time - right     General Gait Details: decreased tolerance to weight on the right leg. duering gait.   Stairs            Wheelchair Mobility    Modified Rankin (Stroke Patients Only)       Balance                                             Pertinent Vitals/Pain Pain Assessment: 0-10 Pain Score: 7  Pain Location: right  hip"bone pain" Pain Descriptors / Indicators: Aching;Discomfort;Pressure;Penetrating;Restless Pain Intervention(s): Monitored during session;Premedicated before session;Repositioned;Ice applied;Limited activity within patient's tolerance;Patient requesting pain meds-RN notified    Home Living Family/patient expects to be discharged to:: Private residence Living Arrangements: Spouse/significant other;Children Available Help at Discharge: Family Type of Home: House Home Access: Stairs to enter Entrance Stairs-Rails: None Entrance Stairs-Number of Steps: 3 Home Layout: Two level;1/2 bath on main level Home Equipment: Walker - 2 wheels;Cane - single point;Bedside commode      Prior Function Level of Independence: Independent               Hand Dominance        Extremity/Trunk Assessment   Upper Extremity Assessment Upper Extremity Assessment: Defer to OT evaluation    Lower Extremity Assessment Lower Extremity Assessment: RLE deficits/detail RLE Deficits / Details: assist to move the leg to bed edge,  decreased weight on the left leg    Cervical / Trunk Assessment Cervical / Trunk Assessment: Normal  Communication   Communication: No difficulties  Cognition Arousal/Alertness: Awake/alert Behavior During Therapy: WFL for tasks assessed/performed Overall Cognitive Status: Within Functional Limits for tasks assessed  General Comments      Exercises Total Joint Exercises Long Arc Quad: AROM;Seated;Right;10 reps   Assessment/Plan    PT Assessment Patient needs continued PT services  PT Problem List Decreased strength;Decreased range of motion;Decreased knowledge of use of DME;Decreased activity tolerance;Decreased safety awareness;Decreased knowledge of precautions;Decreased mobility;Pain       PT Treatment Interventions DME instruction;Therapeutic exercise;Gait training;Stair training;Functional mobility  training;Therapeutic activities;Patient/family education    PT Goals (Current goals can be found in the Care Plan section)  Acute Rehab PT Goals Patient Stated Goal: to walk and go home PT Goal Formulation: With patient Time For Goal Achievement: 03/02/18 Potential to Achieve Goals: Good    Frequency 7X/week   Barriers to discharge        Co-evaluation               AM-PAC PT "6 Clicks" Daily Activity  Outcome Measure Difficulty turning over in bed (including adjusting bedclothes, sheets and blankets)?: A Lot Difficulty moving from lying on back to sitting on the side of the bed? : A Lot Difficulty sitting down on and standing up from a chair with arms (e.g., wheelchair, bedside commode, etc,.)?: A Lot Help needed moving to and from a bed to chair (including a wheelchair)?: A Lot Help needed walking in hospital room?: A Lot Help needed climbing 3-5 steps with a railing? : Total 6 Click Score: 11    End of Session   Activity Tolerance: Patient limited by pain Patient left: in chair;with call bell/phone within reach;with chair alarm set;with family/visitor present Nurse Communication: Mobility status;Patient requests pain meds PT Visit Diagnosis: Unsteadiness on feet (R26.81);Pain Pain - Right/Left: Right Pain - part of body: Hip    Time: [id number] PT Time Calculation (min) (ACUTE ONLY): 26 min   Charges:   PT Evaluation $PT Eval Low Complexity: 1 Low PT Treatments $Gait Training: 8-22 mins        [facility] Yisel Megill PT [phone]   Katherine Sexton, Katherine Sexton 02/27/2018, 12:43 PM

## 2018-02-27 NOTE — Evaluation (Signed)
Occupational Therapy Evaluation Patient Details Name: Katherine Sexton MRN: [id number] DOB: March 21, 1956 Today's Date: Sexton    History of Present Illness 62 yo S/P R direct anterior THA   Clinical Impression   Pt was admitted for the above sx. She was limited by pain and will benefit from one more OT session to further educate on shower transfers.  Goal is for min guard assist    Follow Up Recommendations  Supervision/Assistance - 24 hour    Equipment Recommendations  None recommended by OT    Recommendations for Other Services       Precautions / Restrictions Precautions Precautions: Fall Restrictions Weight Bearing Restrictions: No      Mobility Bed Mobility BY PT Overal bed mobility: Needs Assistance Bed Mobility: Supine to Sit     Supine to sit: Min assist;HOB elevated     General bed mobility comments: assist with right leg and trunk  Transfers BY PT Overall transfer level: Needs assistance Equipment used: Rolling walker (2 wheeled) Transfers: Sit to/from Stand Sit to Stand: Min assist         General transfer comment: cues for hand and right leg position, assist to place right leg forward    Balance                                           ADL either performed or assessed with clinical judgement   ADL Overall ADL's : Needs assistance/impaired             Lower Body Bathing: Minimal assistance;Sit to/from stand       Lower Body Dressing: Moderate assistance;Sit to/from stand                 General ADL Comments: educated on long sponge and kitchen tongs for adls. Pt has assist at home, as needed.  She can perform UB adls with set up.  Pt had increased pain when scooting to edge of chair.  Did not stand this session. Educated on walker safety, working within pain tolerance. Also educated to guard surgical leg with pillows when small grandchildren and dog are around.  Pt has been up to bathroom.  Will practice shower  transfer tomorrow     Vision         Perception     Praxis      Pertinent Vitals/Pain Pain Assessment: 0-10 Pain Score: 6  Pain Location: right hip"bone pain" Pain Descriptors / Indicators: Aching;Discomfort;Pressure;Penetrating;Restless Pain Intervention(s): Limited activity within patient's tolerance;Monitored during session;Repositioned;Patient requesting pain meds-RN notified;Ice applied     Hand Dominance     Extremity/Trunk Assessment Upper Extremity Assessment Upper Extremity Assessment: Overall WFL for tasks assessed      Cervical / Trunk Assessment Cervical / Trunk Assessment: Normal   Communication Communication Communication: No difficulties   Cognition Arousal/Alertness: Awake/alert Behavior During Therapy: WFL for tasks assessed/performed Overall Cognitive Status: Within Functional Limits for tasks assessed                                     General Comments       Exercises Total Joint Exercises Long Arc Quad: AROM;Seated;Right;10 reps   Shoulder Instructions      Home Living Family/patient expects to be discharged to:: Private residence Living Arrangements: Spouse/significant other;Children Available Help at Discharge: Family  Type of Home: House Home Access: Stairs to enter Entergy CorporationEntrance Stairs-Number of Steps: 3 Entrance Stairs-Rails: None Home Layout: Two level;1/2 bath on main level   Alternate Level Stairs-Rails: Left Bathroom Shower/Tub: Arts development officerWalk-in shower   Bathroom Toilet: Handicapped height     Home Equipment: Environmental consultantWalker - 2 wheels;Cane - single point;Bedside commode          Prior Functioning/Environment Level of Independence: Independent                 OT Problem List: Pain;Decreased knowledge of use of DME or AE;Decreased activity tolerance      OT Treatment/Interventions: Self-care/ADL training;DME and/or AE instruction;Patient/family education    OT Goals(Current goals can be found in the care plan  section) Acute Rehab OT Goals Patient Stated Goal: to walk and go home OT Goal Formulation: With patient Time For Goal Achievement: 03/13/18 Potential to Achieve Goals: Good ADL Goals Pt Will Perform Tub/Shower Transfer: Shower transfer;with min guard assist;3 in 1  OT Frequency: Min 2X/week   Barriers to D/C:            Co-evaluation              AM-PAC PT "6 Clicks" Daily Activity     Outcome Measure Help from another person eating meals?: None Help from another person taking care of personal grooming?: A Little Help from another person toileting, which includes using toliet, bedpan, or urinal?: A Little Help from another person bathing (including washing, rinsing, drying)?: A Little Help from another person to put on and taking off regular upper body clothing?: A Little Help from another person to put on and taking off regular lower body clothing?: A Lot 6 Click Score: 18   End of Session    Activity Tolerance: Patient limited by pain Patient left: in chair;with call bell/phone within reach;with chair alarm set;with family/visitor present  OT Visit Diagnosis: Pain Pain - Right/Left: Right Pain - part of body: Hip                Time: [id number] OT Time Calculation (min): 22 min Charges:  OT General Charges $OT Visit: 1 Visit OT Evaluation $OT Eval Low Complexity: 1 Low  Katherine Sexton, OTR/L [phone] Sexton  Katherine Sexton, 3:56 PM

## 2018-02-27 NOTE — Progress Notes (Signed)
Physical Therapy Treatment Patient Details Name: Katherine Sexton MRN: [id number] DOB: 1955/10/14 Today's Date: 02/27/2018    History of Present Illness 62 yo S/P R direct anterior THA    PT Comments    Patient reports that pain is much better controlled. Ambulated x 200' and performed exercises. Plan practice steps next visit.    Follow Up Recommendations  Follow surgeon's recommendation for DC plan and follow-up therapies     Equipment Recommendations  None recommended by PT    Recommendations for Other Services       Precautions / Restrictions Precautions Precautions: Fall Restrictions Weight Bearing Restrictions: No    Mobility  Bed Mobility   Bed Mobility: Sit to Supine       Sit to supine: Min assist   General bed mobility comments: use of belt and cues for technique to place left leg onto bed.  Transfers Overall transfer level: Needs assistance Equipment used: Rolling walker (2 wheeled) Transfers: Sit to/from Stand Sit to Stand: Min guard         General transfer comment: cues for hand and right leg position,   Ambulation/Gait Ambulation/Gait assistance: Min assist Gait Distance (Feet): 200 Feet Assistive device: Rolling walker (2 wheeled) Gait Pattern/deviations: Step-to pattern;Step-through pattern     General Gait Details: cues for sequence, increased weight on right leg and able to perform reciprocal gait with cues.   Stairs             Wheelchair Mobility    Modified Rankin (Stroke Patients Only)       Balance                                            Cognition Arousal/Alertness: Awake/alert Behavior During Therapy: WFL for tasks assessed/performed Overall Cognitive Status: Within Functional Limits for tasks assessed                                        Exercises Total Joint Exercises Ankle Circles/Pumps: AROM;10 reps;Both Quad Sets: AROM;10 reps;Both Short Arc Quad: AROM;Right;10  reps Heel Slides: AAROM;Right;10 reps Hip ABduction/ADduction: AAROM;Right;10 reps    General Comments        Pertinent Vitals/Pain Pain Score: 5  Pain Location: right hip"" Pain Descriptors / Indicators: Aching;Discomfort;Penetrating Pain Intervention(s): Premedicated before session;Monitored during session;Ice applied;Repositioned    Home Living Family/patient expects to be discharged to:: Private residence Living Arrangements: Spouse/significant other;Children Available Help at Discharge: Family         Home Equipment: Dan HumphreysWalker - 2 wheels;Cane - single point;Bedside commode      Prior Function Level of Independence: Independent          PT Goals (current goals can now be found in the care plan section) Acute Rehab PT Goals Patient Stated Goal: to walk and go home Progress towards PT goals: Progressing toward goals    Frequency    7X/week      PT Plan Current plan remains appropriate    Co-evaluation              AM-PAC PT "6 Clicks" Daily Activity  Outcome Measure  Difficulty turning over in bed (including adjusting bedclothes, sheets and blankets)?: A Lot Difficulty moving from lying on back to sitting on the side of the bed? : A Lot Difficulty  sitting down on and standing up from a chair with arms (e.g., wheelchair, bedside commode, etc,.)?: A Little Help needed moving to and from a bed to chair (including a wheelchair)?: A Little Help needed walking in hospital room?: A Little Help needed climbing 3-5 steps with a railing? : Total 6 Click Score: 14    End of Session   Activity Tolerance: Patient limited by pain Patient left: in bed;with call bell/phone within reach Nurse Communication: Mobility status;Patient requests pain meds PT Visit Diagnosis: Unsteadiness on feet (R26.81);Pain Pain - Right/Left: Right Pain - part of body: Hip     Time: 1519-1600 PT Time Calculation (min) (ACUTE ONLY): 41 min  Charges:  $Gait Training: 8-22  mins $Therapeutic Exercise: 8-22 mins $Self Care/Home Management: 8-22                     [facility] LPKaren Jordanna Hendrie PT [phone]    Rada HayHill, Karmela Bram Elizabeth 02/27/2018, 4:44 PM

## 2018-02-28 LAB — CBC
HCT: 32.2 % — ABNORMAL LOW (ref 36.0–46.0)
Hemoglobin: 10.4 g/dL — ABNORMAL LOW (ref 12.0–15.0)
MCH: 31.6 pg (ref 26.0–34.0)
MCHC: 32.3 g/dL (ref 30.0–36.0)
MCV: 97.9 fL (ref 78.0–100.0)
PLATELETS: 350 10*3/uL (ref 150–400)
RBC: 3.29 MIL/uL — AB (ref 3.87–5.11)
RDW: 14 % (ref 11.5–15.5)
WBC: 12.9 10*3/uL — ABNORMAL HIGH (ref 4.0–10.5)

## 2018-02-28 LAB — BASIC METABOLIC PANEL
Anion gap: 8 (ref 5–15)
BUN: 11 mg/dL (ref 6–20)
CALCIUM: 8.5 mg/dL — AB (ref 8.9–10.3)
CO2: 26 mmol/L (ref 22–32)
CREATININE: 0.61 mg/dL (ref 0.44–1.00)
Chloride: 105 mmol/L (ref 98–111)
GFR calc Af Amer: >60 mL/min (ref >60)
GLUCOSE: 108 mg/dL — AB (ref 70–99)
POTASSIUM: 4 mmol/L (ref 3.5–5.1)
Sodium: 139 mmol/L (ref 135–145)

## 2018-02-28 MED ORDER — ASPIRIN 325 MG PO TBEC: 325.0000 mg | DELAYED_RELEASE_TABLET | Freq: Two times a day (BID) | ORAL | 0 refills | Status: AC

## 2018-02-28 MED ORDER — TRAMADOL HCL 50 MG PO TABS: 50.0000 mg | ORAL_TABLET | Freq: Four times a day (QID) | ORAL | 0 refills | Status: DC | PRN

## 2018-02-28 MED ORDER — METHOCARBAMOL 500 MG PO TABS: 500.0000 mg | ORAL_TABLET | Freq: Four times a day (QID) | ORAL | 0 refills | Status: DC | PRN

## 2018-02-28 MED ORDER — HYDROCODONE-ACETAMINOPHEN 5-325 MG PO TABS: 1.0000 | ORAL_TABLET | Freq: Four times a day (QID) | ORAL | 0 refills | Status: DC | PRN

## 2018-02-28 NOTE — Progress Notes (Signed)
Physical Therapy Treatment Patient Details Name: Katherine Sexton MRN: [id number] DOB: Nov 26, 1955 Today's Date: 02/28/2018    History of Present Illness 62 yo S/P R direct anterior THA    PT Comments    The patient  Is progressing well. Plans  DC after practice steps   Follow Up Recommendations  Follow surgeon's recommendation for DC plan and follow-up therapies     Equipment Recommendations  None recommended by PT    Recommendations for Other Services       Precautions / Restrictions Precautions Precautions: Fall    Mobility  Bed Mobility Overal bed mobility: Modified Independent             General bed mobility comments: flat bed with gait belt  Transfers   Equipment used: Rolling walker (2 wheeled)   Sit to Stand: Supervision         General transfer comment: cues for hand and right leg position,   Ambulation/Gait Ambulation/Gait assistance: Min guard Gait Distance (Feet): 250 Feet Assistive device: Rolling walker (2 wheeled) Gait Pattern/deviations: Step-through pattern         Stairs             Wheelchair Mobility    Modified Rankin (Stroke Patients Only)       Balance                                            Cognition Arousal/Alertness: Awake/alert                                            Exercises Total Joint Exercises Ankle Circles/Pumps: AROM;10 reps;Both Quad Sets: AROM;10 reps;Both Short Arc Quad: AROM;Right;10 reps Heel Slides: AAROM;Right;10 reps Hip ABduction/ADduction: AAROM;Right;10 reps Long Arc Quad: AROM;Seated;Right;10 reps Knee Flexion: AROM;Standing;Right;10 reps Marching in Standing: AROM;Standing;Right;10 reps Standing Hip Extension: AROM;Standing;Right;10 reps    General Comments        Pertinent Vitals/Pain Pain Score: 2  Pain Descriptors / Indicators: Sore Pain Intervention(s): Monitored during session;Premedicated before session    Home Living                       Prior Function            PT Goals (current goals can now be found in the care plan section) Progress towards PT goals: Progressing toward goals    Frequency    7X/week      PT Plan Current plan remains appropriate    Co-evaluation              AM-PAC PT "6 Clicks" Daily Activity  Outcome Measure  Difficulty turning over in bed (including adjusting bedclothes, sheets and blankets)?: None Difficulty moving from lying on back to sitting on the side of the bed? : None Difficulty sitting down on and standing up from a chair with arms (e.g., wheelchair, bedside commode, etc,.)?: A Little Help needed moving to and from a bed to chair (including a wheelchair)?: A Little Help needed walking in hospital room?: A Little Help needed climbing 3-5 steps with a railing? : Total 6 Click Score: 18    End of Session   Activity Tolerance: Patient tolerated treatment well Patient left: with call bell/phone within reach;in chair Nurse Communication: Mobility status;Patient  requests pain meds PT Visit Diagnosis: Unsteadiness on feet (R26.81);Pain Pain - Right/Left: Right Pain - part of body: Hip     Time: [id number] PT Time Calculation (min) (ACUTE ONLY): 23 min  Charges:  $Gait Training: 8-22 mins $Therapeutic Exercise: 8-22 mins                        Rada HayHill, Bera Pinela Elizabeth 02/28/2018, 5:07 PM

## 2018-02-28 NOTE — Progress Notes (Signed)
30 Day Unplanned Readmission Risk Score     [facility]  30 Day Unplanned Readmission Risk Score (%)  9 Filed at 02/28/2018 0800     This score is the patient's risk of an unplanned readmission within 30 days of being discharged (0 -100%). The score is based on dignosis, age, lab data, medications, orders, and past utilization.   Low:  0-14.9   Medium: 15-21.9   High: 22-29.9   Extreme: 30 and above        Readmission Risk Prevention Plan 02/28/2018  Post Dischage Appt Complete  Medication Screening Complete  Transportation Screening Complete  PCP follow-up Not Complete  PCP follow-up Not Complete Comment f/u with specialist, PCP as needed  Some recent data might be hidden

## 2018-02-28 NOTE — Progress Notes (Signed)
   Subjective: 2 Days Post-Op Procedure(s) (LRB): RIGHT TOTAL HIP ARTHROPLASTY ANTERIOR APPROACH (Right) Patient reports pain as mild.   Patient seen in rounds by Dr. Lequita HaltAluisio. Patient is well, and has had no acute complaints or problems. States she is ready to go home today. Denies chest pain or SOB. Voiding without difficulty and positive flatus.  Plan is to go Home after hospital stay.  Objective: Vital signs in last 24 hours: Temp:  [97.5 F (36.4 C)-98.5 F (36.9 C)] 97.5 F (36.4 C) (08/16 0521) Pulse Rate:  [68-100] 72 (08/16 0521) Resp:  [14-18] 18 (08/16 0521) BP: (117-147)/(74-97) 142/89 (08/16 0521) SpO2:  [98 %-100 %] 100 % (08/16 0521)  Intake/Output from previous day:  Intake/Output Summary (Last 24 hours) at 02/28/2018 0721 Last data filed at 02/28/2018 0525 Gross per 24 hour  Intake 1559.23 ml  Output 1350 ml  Net 209.23 ml    Labs: Recent Labs    02/27/18 0548 02/28/18 0458  HGB 10.7* 10.4*   Recent Labs    02/27/18 0548 02/28/18 0458  WBC 14.1* 12.9*  RBC 3.42* 3.29*  HCT 33.3* 32.2*  PLT 363 350   Recent Labs    02/27/18 0548 02/28/18 0458  NA 136 139  K 4.2 4.0  CL 104 105  CO2 22 26  BUN 10 11  CREATININE 0.67 0.61  GLUCOSE 170* 108*  CALCIUM 8.3* 8.5*   Recent Labs    02/26/18 1330  INR 1.10    Exam: General - Patient is Alert and Oriented Extremity - Neurologically intact Neurovascular intact Sensation intact distally Dorsiflexion/Plantar flexion intact Dressing/Incision - clean, dry, no drainage Motor Function - intact, moving foot and toes well on exam.   Past Medical History:  Diagnosis Date  . Anxiety   . Chicken pox    childhood   . Hip bursitis    hip bursitis  . Migraines    Dr. Neale BurlyFreeman of headache clinic. Started with Dr. Meryl CrutchAdelman years ago. Sumatriptan about 2x a week when stressed.   Marland Kitchen. PSVT (paroxysmal supraventricular tachycardia) (HCC)    1993. provoked by atypical diet. no issues since then. dehydrated,  wasnt caring for self  . Urinary tract infection     Assessment/Plan: 2 Days Post-Op Procedure(s) (LRB): RIGHT TOTAL HIP ARTHROPLASTY ANTERIOR APPROACH (Right) Principal Problem:   OA (osteoarthritis) of hip  Estimated body mass index is 26.31 kg/m as calculated from the following:   Height as of this encounter: 5\' 8"  (1.727 m).   Weight as of this encounter: 78.5 kg. Up with therapy D/C IV fluids  DVT Prophylaxis - Aspirin Weight-bearing as tolerated  Plan for discharge to home with HEP after one session of therapy. Follow-up in the office in 2 weeks with Dr. Lequita HaltAluisio.  Arther AbbottKristie Edmisten, PA-C Orthopedic Surgery 02/28/2018, 7:21 AM

## 2018-02-28 NOTE — Progress Notes (Signed)
Physical Therapy Treatment Patient Details Name: Katherine ShoeMary Granberg MRN: [id number] DOB: 02/23/1956 Today's Date: 02/28/2018    History of Present Illness 62 yo S/P R direct anterior THA    PT Comments    The patient is ready for Dc.  Follow Up Recommendations  Follow surgeon's recommendation for DC plan and follow-up therapies     Equipment Recommendations  None recommended by PT    Recommendations for Other Services       Precautions / Restrictions Precautions Precautions: Fall    Mobility  Bed Mobility Overal bed mobility: Modified Independent             General bed mobility comments: in chair  Transfers Overall transfer level: Needs assistance Equipment used: Rolling walker (2 wheeled) Transfers: Sit to/from Stand Sit to Stand: Supervision         General transfer comment: cues for hand and right leg position,   Ambulation/Gait Ambulation/Gait assistance: Supervision Gait Distance (Feet): 500 Feet Assistive device: Rolling walker (2 wheeled) Gait Pattern/deviations: Step-through pattern         Stairs Stairs: Yes Stairs assistance: Min assist     General stair comments: spouse present - instructed in 3 steps backward with RW, 6  steps with right crutch and left rail souse assisting.   Wheelchair Mobility    Modified Rankin (Stroke Patients Only)       Balance                                            Cognition Arousal/Alertness: Awake/alert                                            Exercises    General Comments        Pertinent Vitals/Pain Pain Score: 2  Pain Location: right hip"" Pain Descriptors / Indicators: Sore Pain Intervention(s): Monitored during session;Premedicated before session    Home Living                      Prior Function            PT Goals (current goals can now be found in the care plan section) Progress towards PT goals: Progressing toward goals     Frequency    7X/week      PT Plan Current plan remains appropriate    Co-evaluation              AM-PAC PT "6 Clicks" Daily Activity  Outcome Measure  Difficulty turning over in bed (including adjusting bedclothes, sheets and blankets)?: None Difficulty moving from lying on back to sitting on the side of the bed? : None Difficulty sitting down on and standing up from a chair with arms (e.g., wheelchair, bedside commode, etc,.)?: A Little Help needed moving to and from a bed to chair (including a wheelchair)?: A Little Help needed walking in hospital room?: A Little Help needed climbing 3-5 steps with a railing? : A Little 6 Click Score: 20    End of Session   Activity Tolerance: Patient tolerated treatment well Patient left: in chair Nurse Communication: Mobility status;Patient requests pain meds PT Visit Diagnosis: Unsteadiness on feet (R26.81);Pain Pain - Right/Left: Right Pain - part of body: Hip  Time: [id number] PT Time Calculation (min) (ACUTE ONLY): 18 min  Charges:  $Gait Training: 8-22 mins $Therapeutic Exercise: 8-22 mins                        Rada HayHill, Sariah Henkin Elizabeth 02/28/2018, 5:11 PM

## 2018-02-28 NOTE — Progress Notes (Signed)
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   OT comments  All education completed  Follow Up Recommendations  Supervision/Assistance - 24 hour    Equipment Recommendations  None recommended by OT    Recommendations for Other Services      Precautions / Restrictions Precautions Precautions: Fall Restrictions Weight Bearing Restrictions: No       Mobility Bed Mobility Overal bed mobility: Modified Independent             General bed mobility comments: flat bed with gait belt  Transfers   Equipment used: Rolling walker (2 wheeled)   Sit to Stand: Supervision              Balance                                           ADL either performed or assessed with clinical judgement   ADL                                         General ADL Comments: performed toilet transfer, toileting with supervision and shower transfer with min guard assist     Vision       Perception     Praxis      Cognition Arousal/Alertness: Awake/alert Behavior During Therapy: WFL for tasks assessed/performed Overall Cognitive Status: Within Functional Limits for tasks assessed                                          Exercises     Shoulder Instructions       General Comments      Pertinent Vitals/ Pain       Pain Score: 4  Pain Location: right hip"" Pain Descriptors / Indicators: Aching Pain Intervention(s): Limited activity within patient's tolerance;Monitored during session;Premedicated before session;Repositioned;Ice applied  Home Living                                          Prior Functioning/Environment              Frequency           Progress Toward Goals  OT Goals(current goals can now be found in the care plan section)  Progress towards OT goals: Goals met/education completed, patient discharged from [facility] "6 Clicks" Daily Activity     Outcome Measure   Help from another person eating meals?: None Help from another person taking care of personal grooming?: A Little Help from another person toileting, which includes using toliet, bedpan, or urinal?: A Little Help from another person bathing (including washing, rinsing, drying)?: A Little Help from another person to put on and taking off regular upper body clothing?: A Little Help from another person to put on and taking off regular lower body  clothing?: A Lot 6 Click Score: 18    End of Session    OT Visit Diagnosis: Pain Pain - Right/Left: Right Pain - part of body: Hip   Activity Tolerance Patient tolerated treatment well   Patient Left in chair;with call bell/phone within reach   Nurse Communication          Time: 1657-9038 OT Time Calculation (min): 16 min  Charges: OT General Charges $OT Visit: 1 Visit OT Treatments $Self Care/Home Management : 8-22 mins  Lesle Chris, OTR/L [phone] 02/28/2018   [place] 02/28/2018, 8:33 AM

## 2018-02-28 NOTE — Progress Notes (Signed)
RN reviewed discharge instructions with patient and family. All questions answered.   Paperwork and prescriptions given.   NT rolled patient down with all belongings to family car. 

## 2018-03-03 NOTE — Discharge Summary (Signed)
Physician Discharge Summary   Patient ID: Katherine Sexton MRN: [id number] DOB/AGE: 1955-11-06 62 y.o.  Admit date: 02/26/2018 Discharge date: 02/28/2018  Primary Diagnosis: Osteoarthritis, right hip   Admission Diagnoses:  Past Medical History:  Diagnosis Date  . Anxiety   . Chicken pox    childhood   . Hip bursitis    hip bursitis  . Migraines    Dr. Domingo Cocking of headache clinic. Started with Dr. Earley Favor years ago. Sumatriptan about 2x a week when stressed.   Marland Kitchen PSVT (paroxysmal supraventricular tachycardia) ([place])    1993. provoked by atypical diet. no issues since then. dehydrated, wasnt caring for self  . Urinary tract infection    Discharge Diagnoses:   Principal Problem:   OA (osteoarthritis) of hip  Estimated body mass index is 26.31 kg/m as calculated from the following:   Height as of this encounter: 5' 8"  (1.727 m).   Weight as of this encounter: 78.5 kg.  Procedure(s) (LRB): RIGHT TOTAL HIP ARTHROPLASTY ANTERIOR APPROACH (Right)   Consults: None  HPI: Katherine Sexton is a 62 y.o. female who has advanced end-stage arthritis of their Right  hip with progressively worsening pain and dysfunction.The patient has failed nonoperative management and presents for total hip arthroplasty.   Laboratory Data: Admission on 02/26/2018, Discharged on 02/28/2018  Component Date Value Ref Range Status  . aPTT 02/26/2018 32  24 - 36 seconds Final   Performed at [facility], [place] [address], [place], [place] [address]  . Prothrombin Time 02/26/2018 14.2  11.4 - 15.2 seconds Final  . INR 02/26/2018 1.10   Final   Performed at [facility], [place] [address], [place], [place] [address]  . WBC 02/27/2018 14.1* 4.0 - 10.5 K/uL Final  . RBC 02/27/2018 3.42* 3.87 - 5.11 MIL/uL Final  . Hemoglobin 02/27/2018 10.7* 12.0 - 15.0 g/dL Final  . HCT 02/27/2018 33.3* 36.0 - 46.0 % Final  . MCV 02/27/2018 97.4  78.0 - 100.0 fL Final  . MCH 02/27/2018 31.3  26.0 - 34.0  pg Final  . MCHC 02/27/2018 32.1  30.0 - 36.0 g/dL Final  . RDW 02/27/2018 14.1  11.5 - 15.5 % Final  . Platelets 02/27/2018 363  150 - 400 K/uL Final   Performed at [facility], [place] [address] [place], [place] [address]  . Sodium 02/27/2018 136  135 - 145 mmol/L Final  . Potassium 02/27/2018 4.2  3.5 - 5.1 mmol/L Final  . Chloride 02/27/2018 104  98 - 111 mmol/L Final  . CO2 02/27/2018 22  22 - 32 mmol/L Final  . Glucose, Bld 02/27/2018 170* 70 - 99 mg/dL Final  . BUN 02/27/2018 10  6 - 20 mg/dL Final  . Creatinine, Ser 02/27/2018 0.67  0.44 - 1.00 mg/dL Final  . Calcium 02/27/2018 8.3* 8.9 - 10.3 mg/dL Final  . GFR calc non Af Amer 02/27/2018 >60  >60 mL/min Final  . GFR calc Af Amer 02/27/2018 >60  >60 mL/min Final   Comment: (NOTE) The eGFR has been calculated using the CKD EPI equation. This calculation has not been validated in all clinical situations. eGFR's persistently <60 mL/min signify possible Chronic Kidney Disease.   Georgiann Hahn gap 02/27/2018 10  5 - 15 Final   Performed at [facility], [place] [address] [place], [place] [address]  . WBC 02/28/2018 12.9* 4.0 - 10.5 K/uL Final  . RBC 02/28/2018 3.29* 3.87 - 5.11 MIL/uL Final  . Hemoglobin 02/28/2018 10.4*  12.0 - 15.0 g/dL Final  . HCT 02/28/2018 32.2* 36.0 - 46.0 % Final  . MCV 02/28/2018 97.9  78.0 - 100.0 fL Final  . MCH 02/28/2018 31.6  26.0 - 34.0 pg Final  . MCHC 02/28/2018 32.3  30.0 - 36.0 g/dL Final  . RDW 02/28/2018 14.0  11.5 - 15.5 % Final  . Platelets 02/28/2018 350  150 - 400 K/uL Final   Performed at [facility], [place] [address], [place], [place] [address]  . Sodium 02/28/2018 139  135 - 145 mmol/L Final  . Potassium 02/28/2018 4.0  3.5 - 5.1 mmol/L Final  . Chloride 02/28/2018 105  98 - 111 mmol/L Final  . CO2 02/28/2018 26  22 - 32 mmol/L Final  . Glucose, Bld 02/28/2018 108* 70 - 99 mg/dL Final  . BUN 02/28/2018 11  6 - 20 mg/dL Final  .  Creatinine, Ser 02/28/2018 0.61  0.44 - 1.00 mg/dL Final  . Calcium 02/28/2018 8.5* 8.9 - 10.3 mg/dL Final  . GFR calc non Af Amer 02/28/2018 >60  >60 mL/min Final  . GFR calc Af Amer 02/28/2018 >60  >60 mL/min Final   Comment: (NOTE) The eGFR has been calculated using the CKD EPI equation. This calculation has not been validated in all clinical situations. eGFR's persistently <60 mL/min signify possible Chronic Kidney Disease.   Georgiann Hahn gap 02/28/2018 8  5 - 15 Final   Performed at [facility], [place] [address], [place], [place] [address]  Hospital Outpatient Visit on 02/18/2018  Component Date Value Ref Range Status  . MRSA, PCR 02/18/2018 NEGATIVE  NEGATIVE Final  . Staphylococcus aureus 02/18/2018 NEGATIVE  NEGATIVE Final   Comment: (NOTE) The Xpert SA Assay (FDA approved for NASAL specimens in patients 18 years of age and older), is one component of a comprehensive surveillance program. It is not intended to diagnose infection nor to guide or monitor treatment. Performed at [facility], [place] [address], [place], [place] [address]   . WBC 02/18/2018 7.2  4.0 - 10.5 K/uL Final  . RBC 02/18/2018 4.10  3.87 - 5.11 MIL/uL Final  . Hemoglobin 02/18/2018 12.8  12.0 - 15.0 g/dL Final  . HCT 02/18/2018 40.0  36.0 - 46.0 % Final  . MCV 02/18/2018 97.6  78.0 - 100.0 fL Final  . MCH 02/18/2018 31.2  26.0 - 34.0 pg Final  . MCHC 02/18/2018 32.0  30.0 - 36.0 g/dL Final  . RDW 02/18/2018 14.2  11.5 - 15.5 % Final  . Platelets 02/18/2018 452* 150 - 400 K/uL Final   Performed at [facility], [place] [address], [place], [place] [address]  . Sodium 02/18/2018 141  135 - 145 mmol/L Final  . Potassium 02/18/2018 4.2  3.5 - 5.1 mmol/L Final  . Chloride 02/18/2018 104  98 - 111 mmol/L Final  . CO2 02/18/2018 29  22 - 32 mmol/L Final  . Glucose, Bld 02/18/2018 97  70 - 99 mg/dL Final  . BUN 02/18/2018 13  6 - 20 mg/dL Final  . Creatinine, Ser  02/18/2018 0.75  0.44 - 1.00 mg/dL Final  . Calcium 02/18/2018 9.5  8.9 - 10.3 mg/dL Final  . Total Protein 02/18/2018 7.2  6.5 - 8.1 g/dL Final  . Albumin 02/18/2018 4.0  3.5 - 5.0 g/dL Final  . AST 02/18/2018 19  15 - 41 U/L Final  . ALT 02/18/2018 16  0 - 44 U/L Final  . Alkaline Phosphatase 02/18/2018  50  38 - 126 U/L Final  . Total Bilirubin 02/18/2018 0.5  0.3 - 1.2 mg/dL Final  . GFR calc non Af Amer 02/18/2018 >60  >60 mL/min Final  . GFR calc Af Amer 02/18/2018 >60  >60 mL/min Final   Comment: (NOTE) The eGFR has been calculated using the CKD EPI equation. This calculation has not been validated in all clinical situations. eGFR's persistently <60 mL/min signify possible Chronic Kidney Disease.   Georgiann Hahn gap 02/18/2018 8  5 - 15 Final   Performed at [facility], [place] [address], [place], [place] [address]  . ABO/RH(D) 02/18/2018 A POS   Final  . Antibody Screen 02/18/2018 NEG   Final  . Sample Expiration 02/18/2018 03/01/2018   Final  . Extend sample reason 02/18/2018    Final                   Value:NO TRANSFUSIONS OR PREGNANCY IN THE PAST 3 MONTHS Performed at [facility], [place] [address], [place], [place] [address]   . Color, Urine 02/18/2018 YELLOW  YELLOW Final  . APPearance 02/18/2018 CLEAR  CLEAR Final  . Specific Gravity, Urine 02/18/2018 1.018  1.005 - 1.030 Final  . pH 02/18/2018 5.0  5.0 - 8.0 Final  . Glucose, UA 02/18/2018 NEGATIVE  NEGATIVE mg/dL Final  . Hgb urine dipstick 02/18/2018 MODERATE* NEGATIVE Final  . Bilirubin Urine 02/18/2018 NEGATIVE  NEGATIVE Final  . Ketones, ur 02/18/2018 NEGATIVE  NEGATIVE mg/dL Final  . Protein, ur 02/18/2018 NEGATIVE  NEGATIVE mg/dL Final  . Nitrite 02/18/2018 POSITIVE* NEGATIVE Final  . Leukocytes, UA 02/18/2018 MODERATE* NEGATIVE Final  . RBC / HPF 02/18/2018 0-5  0 - 5 RBC/hpf Final  . WBC, UA 02/18/2018 11-20  0 - 5 WBC/hpf Final  . Bacteria, UA 02/18/2018 FEW* NONE SEEN Final    . Squamous Epithelial / LPF 02/18/2018 0-5  0 - 5 Final  . Mucus 02/18/2018 PRESENT   Final   Performed at [facility], [place] [address], [place], [place] [address]  . ABO/RH(D) 02/18/2018    Final                   Value:A POS Performed at [facility], [place] [address] [place], [place] [address]      X-Rays:Dg Pelvis Portable  Result Date: 02/26/2018 CLINICAL DATA:  Status post right hip replacement EXAM: PORTABLE PELVIS 1-2 VIEWS COMPARISON:  Intraoperative films from earlier in the same day. FINDINGS: Right hip replacement is noted in satisfactory position. No acute bony or soft tissue abnormality is seen. Surgical is place. IMPRESSION: Status post right hip replacement Electronically Signed   By: Inez Catalina M.D.   On: 02/26/2018 19:36   Dg C-arm 1-60 Min-no Report  Result Date: 02/26/2018 Fluoroscopy was utilized by the requesting physician.  No radiographic interpretation.    EKG: Orders placed or performed in visit on 03/23/08  . Converted CEMR EKG     Hospital Course: Patient was admitted to [facility] and taken to the OR and underwent the above state procedure without complications.  Patient tolerated the procedure well and was later transferred to the recovery room and then to the orthopaedic floor for postoperative care.  They were given PO and IV analgesics for pain control following their surgery.  They were given 24 hours of postoperative antibiotics of  Anti-infectives (From admission, onward)   Start     Dose/Rate Route Frequency  Ordered Stop   02/27/18 0600  ceFAZolin (ANCEF) IVPB 2g/100 mL premix     2 g 200 mL/hr over 30 Minutes Intravenous On call to O.R. 02/26/18 1311 02/26/18 1541   02/26/18 2200  ceFAZolin (ANCEF) IVPB 2g/100 mL premix     2 g 200 mL/hr over 30 Minutes Intravenous Every 6 hours 02/26/18 1910 02/27/18 0704   02/26/18 1315  ceFAZolin (ANCEF) 2-4 GM/100ML-% IVPB    Note to Pharmacy:  Waldron Session   : cabinet override      02/26/18 1315 02/26/18 1511     and started on DVT prophylaxis in the form of Aspirin.   PT and OT were ordered for total hip protocol.  The patient was allowed to be WBAT with therapy. Discharge planning was consulted to help with postop disposition and equipment needs.  Patient had a good night on the evening of surgery. She started to get up OOB with therapy on POD #1. Hemovac drain was pulled without difficulty. She continued to work with therapy into POD #2. Pt was seen during rounds and was ready to go home pending progress with therapy. Dressing was changed and the incision was clean, dry, and intact with no drainage. She completed two additional sessions of therapy and was meeting her goals. She was discharged to home later that day in stable condition.  Diet: Regular diet Activity:WBAT Follow-up:in 2 weeks with Dr. Wynelle Link Disposition - Home with HEP Discharged Condition: stable   Discharge Instructions    Call MD / Call 911   Complete by:  As directed    If you experience chest pain or shortness of breath, CALL 911 and be transported to the hospital emergency room.  If you develope a fever above 101 F, pus (white drainage) or increased drainage or redness at the wound, or calf pain, call your surgeon's office.   Change dressing   Complete by:  As directed    Change the dressing daily with sterile 4 x 4 inch gauze dressing and paper tape.   Constipation Prevention   Complete by:  As directed    Drink plenty of fluids.  Prune juice may be helpful.  You may use a stool softener, such as Colace (over the counter) 100 mg twice a day.  Use MiraLax (over the counter) for constipation as needed.   Diet - low sodium heart healthy   Complete by:  As directed    Discharge instructions   Complete by:  As directed    Dr. Gaynelle Arabian Total Joint Specialist [facility] [address] [place] [address], [place], [place] [address] [phone]  ANTERIOR APPROACH  TOTAL HIP REPLACEMENT POSTOPERATIVE DIRECTIONS   Hip Rehabilitation, Guidelines Following Surgery  The results of a hip operation are greatly improved after range of motion and muscle strengthening exercises. Follow all safety measures which are given to protect your hip. If any of these exercises cause increased pain or swelling in your joint, decrease the amount until you are comfortable again. Then slowly increase the exercises. Call your caregiver if you have problems or questions.   HOME CARE INSTRUCTIONS  Remove items at home which could result in a fall. This includes throw rugs or furniture in walking pathways.  ICE to the affected hip every three hours for 30 minutes at a time and then as needed for pain and swelling.  Continue to use ice on the hip for pain and swelling from surgery. You may notice swelling that will progress down to  the foot and ankle.  This is normal after surgery.  Elevate the leg when you are not up walking on it.   Continue to use the breathing machine which will help keep your temperature down.  It is common for your temperature to cycle up and down following surgery, especially at night when you are not up moving around and exerting yourself.  The breathing machine keeps your lungs expanded and your temperature down.  DIET You may resume your previous home diet once your are discharged from the hospital.  DRESSING / WOUND CARE / SHOWERING You may shower 3 days after surgery, but keep the wounds dry during showering.  You may use an occlusive plastic wrap (Press'n Seal for example), NO SOAKING/SUBMERGING IN THE BATHTUB.  If the bandage gets wet, change with a clean dry gauze.  If the incision gets wet, pat the wound dry with a clean towel. You may start showering once you are discharged home but do not submerge the incision under water. Just pat the incision dry and apply a dry gauze dressing on daily. Change the surgical dressing daily and reapply a dry dressing  each time.  ACTIVITY Walk with your walker as instructed. Use walker as long as suggested by your caregivers. Avoid periods of inactivity such as sitting longer than an hour when not asleep. This helps prevent blood clots.  You may resume a sexual relationship in one month or when given the OK by your doctor.  You may return to work once you are cleared by your doctor.  Do not drive a car for 6 weeks or until released by you surgeon.  Do not drive while taking narcotics.  WEIGHT BEARING Weight bearing as tolerated with assist device (walker, cane, etc) as directed, use it as long as suggested by your surgeon or therapist, typically at least 4-6 weeks.  POSTOPERATIVE CONSTIPATION PROTOCOL Constipation - defined medically as fewer than three stools per week and severe constipation as less than one stool per week.  One of the most common issues patients have following surgery is constipation.  Even if you have a regular bowel pattern at home, your normal regimen is likely to be disrupted due to multiple reasons following surgery.  Combination of anesthesia, postoperative narcotics, change in appetite and fluid intake all can affect your bowels.  In order to avoid complications following surgery, here are some recommendations in order to help you during your recovery period.  Colace (docusate) - Pick up an over-the-counter form of Colace or another stool softener and take twice a day as long as you are requiring postoperative pain medications.  Take with a full glass of water daily.  If you experience loose stools or diarrhea, hold the colace until you stool forms back up.  If your symptoms do not get better within 1 week or if they get worse, check with your doctor.  Dulcolax (bisacodyl) - Pick up over-the-counter and take as directed by the product packaging as needed to assist with the movement of your bowels.  Take with a full glass of water.  Use this product as needed if not relieved by Colace  only.   MiraLax (polyethylene glycol) - Pick up over-the-counter to have on hand.  MiraLax is a solution that will increase the amount of water in your bowels to assist with bowel movements.  Take as directed and can mix with a glass of water, juice, soda, coffee, or tea.  Take if you go more than two days  without a movement. Do not use MiraLax more than once per day. Call your doctor if you are still constipated or irregular after using this medication for 7 days in a row.  If you continue to have problems with postoperative constipation, please contact the office for further assistance and recommendations.  If you experience "the worst abdominal pain ever" or develop nausea or vomiting, please contact the office immediatly for further recommendations for treatment.  ITCHING  If you experience itching with your medications, try taking only a single pain pill, or even half a pain pill at a time.  You can also use Benadryl over the counter for itching or also to help with sleep.   TED HOSE STOCKINGS Wear the elastic stockings on both legs for three weeks following surgery during the day but you may remove then at night for sleeping.  MEDICATIONS See your medication summary on the "After Visit Summary" that the nursing staff will review with you prior to discharge.  You may have some home medications which will be placed on hold until you complete the course of blood thinner medication.  It is important for you to complete the blood thinner medication as prescribed by your surgeon.  Continue your approved medications as instructed at time of discharge.  PRECAUTIONS If you experience chest pain or shortness of breath - call 911 immediately for transfer to the hospital emergency department.  If you develop a fever greater that 101 F, purulent drainage from wound, increased redness or drainage from wound, foul odor from the wound/dressing, or calf pain - CONTACT YOUR SURGEON.                                                    FOLLOW-UP APPOINTMENTS Make sure you keep all of your appointments after your operation with your surgeon and caregivers. You should call the office at the above phone number and make an appointment for approximately two weeks after the date of your surgery or on the date instructed by your surgeon outlined in the "After Visit Summary".  RANGE OF MOTION AND STRENGTHENING EXERCISES  These exercises are designed to help you keep full movement of your hip joint. Follow your caregiver's or physical therapist's instructions. Perform all exercises about fifteen times, three times per day or as directed. Exercise both hips, even if you have had only one joint replacement. These exercises can be done on a training (exercise) mat, on the floor, on a table or on a bed. Use whatever works the best and is most comfortable for you. Use music or television while you are exercising so that the exercises are a pleasant break in your day. This will make your life better with the exercises acting as a break in routine you can look forward to.  Lying on your back, slowly slide your foot toward your buttocks, raising your knee up off the floor. Then slowly slide your foot back down until your leg is straight again.  Lying on your back spread your legs as far apart as you can without causing discomfort.  Lying on your side, raise your upper leg and foot straight up from the floor as far as is comfortable. Slowly lower the leg and repeat.  Lying on your back, tighten up the muscle in the front of your thigh (quadriceps muscles). You can do  this by keeping your leg straight and trying to raise your heel off the floor. This helps strengthen the largest muscle supporting your knee.  Lying on your back, tighten up the muscles of your buttocks both with the legs straight and with the knee bent at a comfortable angle while keeping your heel on the floor.   IF YOU ARE TRANSFERRED TO A SKILLED REHAB  FACILITY If the patient is transferred to a skilled rehab facility following release from the hospital, a list of the current medications will be sent to the facility for the patient to continue.  When discharged from the skilled rehab facility, please have the facility set up the patient's [place] prior to being released. Also, the skilled facility will be responsible for providing the patient with their medications at time of release from the facility to include their pain medication, the muscle relaxants, and their blood thinner medication. If the patient is still at the rehab facility at time of the two week follow up appointment, the skilled rehab facility will also need to assist the patient in arranging follow up appointment in our office and any transportation needs.  MAKE SURE YOU:  Understand these instructions.  Get help right away if you are not doing well or get worse.    Pick up stool softner and laxative for home use following surgery while on pain medications. Do not submerge incision under water. Please use good hand washing techniques while changing dressing each day. May shower starting three days after surgery. Please use a clean towel to pat the incision dry following showers. Continue to use ice for pain and swelling after surgery. Do not use any lotions or creams on the incision until instructed by your surgeon.   Do not sit on low chairs, stoools or toilet seats, as it may be difficult to get up from low surfaces   Complete by:  As directed    Driving restrictions   Complete by:  As directed    No driving for two weeks   TED hose   Complete by:  As directed    Use stockings (TED hose) for three weeks on both leg(s).  You may remove them at night for sleeping.   Weight bearing as tolerated   Complete by:  As directed      Allergies as of 02/28/2018   No Known Allergies     Medication List    STOP taking these medications   ciprofloxacin 250  MG tablet Commonly known as:  CIPRO     TAKE these medications   amphetamine-dextroamphetamine 20 MG tablet Commonly known as:  ADDERALL Take 20 mg by mouth 2 (two) times daily.   aspirin 325 MG EC tablet Take 1 tablet (325 mg total) by mouth 2 (two) times daily for 19 days. Take one tablet (325 mg) Aspirin two times a day for three weeks following surgery. Then take one baby Aspirin (81 mg) once a day for three weeks. Then discontinue aspirin.   calcium carbonate 500 MG chewable tablet Commonly known as:  TUMS - dosed in mg elemental calcium Chew 1 tablet by mouth daily.   erythromycin ophthalmic ointment Apply small amount to stye 4x a day for up to 5-7 days. Continue warm compresses 4x a day as well   HYDROcodone-acetaminophen 5-325 MG tablet Commonly known as:  NORCO/VICODIN Take 1-2 tablets by mouth every 6 (six) hours as needed for moderate pain (pain score 4-6).   IMITREX 100 MG  tablet Generic drug:  SUMAtriptan Take 100 mg by mouth every 2 (two) hours as needed for migraine. May repeat in 2 hours if headache persists or recurs.   IRON PO Take 1 tablet by mouth daily.   MAGNESIUM PO Take 1 tablet by mouth daily.   methocarbamol 500 MG tablet Commonly known as:  ROBAXIN Take 1 tablet (500 mg total) by mouth every 6 (six) hours as needed for muscle spasms.   multivitamin with minerals Tabs tablet Take 1 tablet by mouth daily.   traMADol 50 MG tablet Commonly known as:  ULTRAM Take 1-2 tablets (50-100 mg total) by mouth every 6 (six) hours as needed for moderate pain (use if vicodin is not effective).            Discharge Care Instructions  (From admission, onward)         Start     Ordered   02/28/18 0000  Weight bearing as tolerated     02/28/18 0728   02/28/18 0000  Change dressing    Comments:  Change the dressing daily with sterile 4 x 4 inch gauze dressing and paper tape.   02/28/18 0728         Follow-up Information    Gaynelle Arabian, MD.  Schedule an appointment as soon as possible for a visit on 03/11/2018.   Specialty:  Orthopedic Surgery Contact information: [address] [place] [address] [phone]           Signed: Theresa Duty PA-C Orthopaedic Surgery 03/03/2018, 10:18 AM

## 2018-04-01 DIAGNOSIS — M1611 Unilateral primary osteoarthritis, right hip: Secondary | ICD-10-CM | POA: Diagnosis not present

## 2018-10-01 LAB — HM MAMMOGRAPHY

## 2018-10-20 ENCOUNTER — Encounter: Payer: Self-pay | Admitting: Family Medicine

## 2018-11-04 ENCOUNTER — Encounter: Payer: Self-pay | Admitting: Family Medicine

## 2019-04-28 LAB — HM MAMMOGRAPHY

## 2019-09-04 ENCOUNTER — Encounter: Payer: Self-pay | Admitting: Family Medicine

## 2019-09-04 ENCOUNTER — Ambulatory Visit (INDEPENDENT_AMBULATORY_CARE_PROVIDER_SITE_OTHER): Payer: 59 | Admitting: Family Medicine

## 2019-09-04 ENCOUNTER — Other Ambulatory Visit: Payer: Self-pay

## 2019-09-04 VITALS — Ht 68.0 in | Wt 160.0 lb

## 2019-09-04 DIAGNOSIS — R1013 Epigastric pain: Secondary | ICD-10-CM | POA: Diagnosis not present

## 2019-09-04 DIAGNOSIS — E785 Hyperlipidemia, unspecified: Secondary | ICD-10-CM

## 2019-09-04 DIAGNOSIS — Z1211 Encounter for screening for malignant neoplasm of colon: Secondary | ICD-10-CM

## 2019-09-04 NOTE — Progress Notes (Signed)
Called l/m to call office  

## 2019-09-04 NOTE — Patient Instructions (Addendum)
Health Maintenance Due  Topic Date Due  . COLONOSCOPY -We will call you within two weeks about your referral to GI. If you do not hear within 3 weeks, give [place] a call.   05/09/2007  . PAP SMEAR-Modifier -has one scheduled. Has had one since 2014- will get recods 02/14/2016  . TETANUS/TDAP - schedule nurse visit when comes by for labs 03/23/2018  . INFLUENZA VACCINE -does not get 02/14/2019

## 2019-09-04 NOTE — Progress Notes (Signed)
Phone [phone] Virtual visit via Video note   Subjective:  Chief complaint: No chief complaint on file.   This visit type was conducted due to national recommendations for restrictions regarding the COVID-19 Pandemic (e.g. social distancing).  This format is felt to be most appropriate for this patient at this time balancing risks to patient and risks to population by having him in for in person visit.  No physical exam was performed (except for noted visual exam or audio findings with Telehealth visits).    Our team/I connected with Nelda Bucks at 11:20 AM EST by a video enabled telemedicine application (doxy.me or caregility through epic) and verified that I am speaking with the correct person using two identifiers.  Location patient: Home-O2 Location provider: [facility], office Persons participating in the virtual visit:  patient  Our team/I discussed the limitations of evaluation and management by telemedicine and the availability of in person appointments. In light of current covid-19 pandemic, patient also understands that we are trying to protect them by minimizing in office contact if at all possible.  The patient expressed consent for telemedicine visit and agreed to proceed. Patient understands insurance will be billed.   Past Medical History-  Patient Active Problem List   Diagnosis Date Noted  . Hyperlipidemia 01/06/2018    Priority: Medium  . ADD (attention deficit disorder) 10/22/2017    Priority: Medium  . Migraines     Priority: Medium  . History of PSVT (paroxysmal supraventricular tachycardia) 03/23/2008    Priority: Low  . OA (osteoarthritis) of hip 02/26/2018    Medications- reviewed and updated Current Outpatient Medications  Medication Sig Dispense Refill  . amphetamine-dextroamphetamine (ADDERALL) 20 MG tablet Take 20 mg by mouth 2 (two) times daily.   0  . IRON PO Take 1 tablet by mouth daily.    Marland Kitchen MAGNESIUM PO Take 1 tablet by mouth daily.    .  Multiple Vitamin (MULTIVITAMIN WITH MINERALS) TABS tablet Take 1 tablet by mouth daily.    . SUMAtriptan (IMITREX) 100 MG tablet Take 100 mg by mouth every 2 (two) hours as needed for migraine. May repeat in 2 hours if headache persists or recurs.    . calcium carbonate (TUMS - DOSED IN MG ELEMENTAL CALCIUM) 500 MG chewable tablet Chew 1 tablet by mouth daily.     No current facility-administered medications for this visit.     Objective:  Ht 5\' 8"  (1.727 m)   Wt 160 lb (72.6 kg)   BMI 24.33 kg/m  self reported vitals Gen: NAD, resting comfortably Lungs: nonlabored, normal respiratory rate  Skin: appears dry, no obvious rash     Assessment and Plan  #Burning in Stomach/GERD S: Pt c/o burning in stomach due to stress recently. Daughter beth diagnosed with breast cancer and then sister who is younger with her diagnosed with colon cancer. Mother passed away in 21-Aug-2022 (had covid but recovered)- died at 75. Settling estate has been a stressor as well.   Right hip surgery 2 years ago- left hip is bothering her some. She has tried some BC powder for 2 weeks - stomach started acting up around that time and she stopped. Had taken aleve in past and asks about taking this again.   She has taken generic acid reducer from [employer] called esomeprazole which seemed to improve this sensation and she feels much better now.   She states at least 80% better.  A/P: I strongly suspect GERD as cause of symptoms.  It  is also possible she developed an ulceration given prior NSAID use-advised against NSAID use.  She has had significant relief with his omeprazole-recommended at least a month worth of treatment.  If she stops at that point and continues to have symptoms should extend to 2 months.  If has new or worsening symptoms she should return to see [place] and we can consider prescription strength-she declines stronger dose for now.  Also patient with family history of colon cancer-referred her for  colonoscopy today.  For osteoarthritis of the hip-I recommended she avoid NSAIDs-recommended trial of Tylenol arthritis  Forgot to mention during visit but will have my team call and give her the name for our behavioral health specialist  #hyperlipidemia S: Untreated/poor control Lab Results  Component Value Date   CHOL 302 (H) 11/06/2017   HDL 69.80 11/06/2017   LDLDIRECT 187.0 11/06/2017   TRIG 291.0 (H) 11/06/2017   CHOLHDL 4 11/06/2017   A/P: I also told patient with last labs being over a year ago that we should have her in for blood work-she agrees to come by.  If lipids remain as elevated as above we may need to consider statin.  She would prefer to work on lifestyle choices -We will see if staff can obtain blood pressure when she comes by for Tdap and blood work  Recommended follow up: We did not specifically discuss but would be worth setting up a physical sometime within the next 6 months  Lab/Order associations:   ICD-10-CM   1. Hyperlipidemia, unspecified hyperlipidemia type  E78.5 CBC with Differential/Platelet    Comprehensive metabolic panel    Lipid panel  2. Epigastric pain  R10.13 CBC with Differential/Platelet    Comprehensive metabolic panel  3. Screen for colon cancer  Z12.11 Ambulatory referral to Gastroenterology   Return precautions advised.  Tana Conch, MD

## 2019-12-07 ENCOUNTER — Encounter: Payer: Self-pay | Admitting: Family Medicine

## 2019-12-07 NOTE — Patient Instructions (Addendum)
Health Maintenance Due  Topic Date Due  . COLONOSCOPY We will call you within two weeks about your referral. If you do not hear within 3 weeks, give [place] a call.  Never done  . PAP SMEAR-Modifier will send for notes  02/14/2016  . TETANUS/TDAP- needs to update with Td 03/23/2018

## 2019-12-07 NOTE — Progress Notes (Signed)
Phone [phone] Virtual visit via Video note   Subjective:  Chief complaint: Chief Complaint  Patient presents with  . Tick Bite   This visit type was conducted due to national recommendations for restrictions regarding the COVID-19 Pandemic (e.g. social distancing).  This format is felt to be most appropriate for this patient at this time balancing risks to patient and risks to population by having him in for in person visit.  No physical exam was performed (except for noted visual exam or audio findings with Telehealth visits).    Our team/I connected with Jacinta Shoe at  8:00 AM EDT by a video enabled telemedicine application (doxy.me or caregility through epic) and verified that I am speaking with the correct person using two identifiers.  Location patient: Home-O[place] Location provider: [facility], office Persons participating in the virtual visit:  patient  Our team/I discussed the limitations of evaluation and management by telemedicine and the availability of in person appointments. In light of current covid-19 pandemic, patient also understands that we are trying to protect them by minimizing in office contact if at all possible.  The patient expressed consent for telemedicine visit and agreed to proceed. Patient understands insurance will be billed.   Past Medical History-  Patient Active Problem List   Diagnosis Date Noted  . Hyperlipidemia 01/06/2018    Priority: Medium  . ADD (attention deficit disorder) 10/22/2017    Priority: Medium  . Migraines     Priority: Medium  . History of PSVT (paroxysmal supraventricular tachycardia) 03/23/2008    Priority: Low  . OA (osteoarthritis) of hip 02/26/2018    Medications- reviewed and updated Current Outpatient Medications  Medication Sig Dispense Refill  . amphetamine-dextroamphetamine (ADDERALL) 20 MG tablet Take 20 mg by mouth 2 (two) times daily.   0  . calcium carbonate (TUMS - DOSED IN MG ELEMENTAL CALCIUM) 500 MG  chewable tablet Chew 1 tablet by mouth daily.    . IRON PO Take 1 tablet by mouth daily.    Marland Kitchen MAGNESIUM PO Take 1 tablet by mouth daily.    . Multiple Vitamin (MULTIVITAMIN WITH MINERALS) TABS tablet Take 1 tablet by mouth daily.    . SUMAtriptan (IMITREX) 100 MG tablet Take 100 mg by mouth every 2 (two) hours as needed for migraine. May repeat in 2 hours if headache persists or recurs.     No current facility-administered medications for this visit.     Objective:  Ht 5\' 8"  (1.727 m)   Wt 170 lb (77.1 kg)   BMI 25.85 kg/m  self reported vitals Gen: NAD, resting comfortably Lungs: nonlabored, normal respiratory rate  Skin: appears dry, no obvious rash, she was unable to get the camera into the area of the tick bite as it is on posterior right shoulder    Assessment and Plan   Tick Bite S:[place] found tick on shoulder that was able to remove only part of it. Was small tick with white dot- lone star tick. Thinks it was attached less than 4 hours   Denies any rash in area, fever, headache or nausea or vomiting. . Was not able to remove head initially but later came out. Area is red and tender to touch but not expanding-thinks it is just irritated where they tried to pull the tick off. Using hydrogen peroxide and antibacterial cream.  A/P: Tick bite of right shoulder.  Patient was worried about alpha gal-we reviewed signs and symptoms but none currently present. -Patient is  due for tetanus shot-she will come by the office today or tomorrow for that Immunization History  Administered Date(s) Administered  . Td 03/23/2008  -We reviewed RMSF signs and symptoms to look out for -Since this was a [place] tick no risk of Lyme disease  #Health maintenance-sister with colon cancer-patient agrees finally to referral to GI for colon cancer screening  Recommended follow up: Return in about 6 months (around 06/10/2020) for physical or sooner if needed.  Lab/Order  associations:   ICD-10-CM   1. Tick bite of right shoulder, initial encounter  S40.261A    W57.XXXA   2. Screening for colon cancer  Z12.11 Ambulatory referral to Gastroenterology   Time Spent: 16 minutes of total time (8:00 AM- 8:16 AM) was spent on the date of the encounter performing the following actions: chart review prior to seeing the patient, obtaining history, performing a medically necessary exam, counseling on the treatment plan, placing orders, and documenting in our EHR.    Return precautions advised.  Garret Reddish, MD

## 2019-12-09 ENCOUNTER — Other Ambulatory Visit: Payer: Self-pay

## 2019-12-09 ENCOUNTER — Encounter: Payer: Self-pay | Admitting: Family Medicine

## 2019-12-09 ENCOUNTER — Telehealth (INDEPENDENT_AMBULATORY_CARE_PROVIDER_SITE_OTHER): Payer: 59 | Admitting: Family Medicine

## 2019-12-09 VITALS — Ht 68.0 in | Wt 170.0 lb

## 2019-12-09 DIAGNOSIS — Z1211 Encounter for screening for malignant neoplasm of colon: Secondary | ICD-10-CM | POA: Diagnosis not present

## 2019-12-09 DIAGNOSIS — W57XXXA Bitten or stung by nonvenomous insect and other nonvenomous arthropods, initial encounter: Secondary | ICD-10-CM | POA: Diagnosis not present

## 2019-12-09 DIAGNOSIS — S40261A Insect bite (nonvenomous) of right shoulder, initial encounter: Secondary | ICD-10-CM

## 2019-12-09 NOTE — Progress Notes (Signed)
Called and lm for pt tcb. 

## 2020-01-16 IMAGING — DX DG PORTABLE PELVIS
1 series · 1 of 1 positions shown · non-contrast
Comparison: Intraoperative films from earlier in the same day.

CLINICAL DATA: Status post right hip replacement

EXAM:
PORTABLE PELVIS 1-2 VIEWS

[pelvis ap]
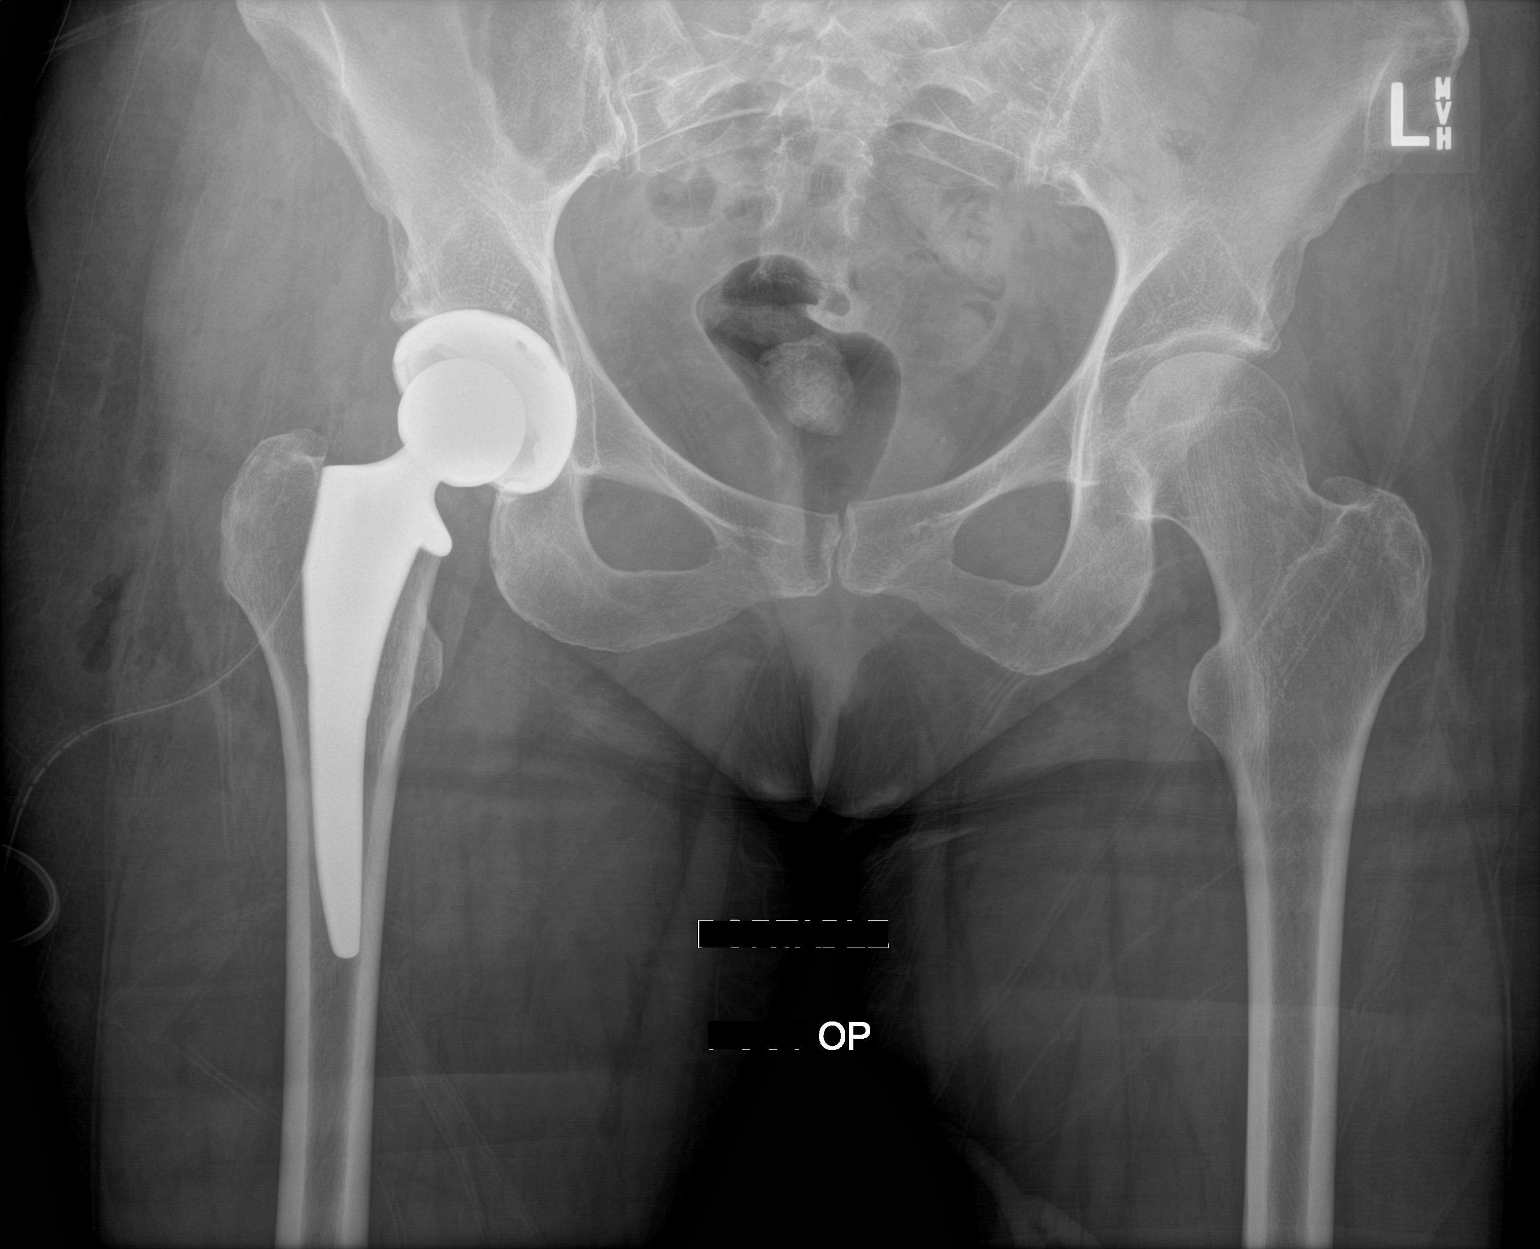

[1 of 1 positions shown; findings below may reference images not displayed]

FINDINGS: Right hip replacement is noted in satisfactory position. No acute
bony or soft tissue abnormality is seen. Surgical is place.
IMPRESSION: Status post right hip replacement

## 2020-11-07 ENCOUNTER — Telehealth (INDEPENDENT_AMBULATORY_CARE_PROVIDER_SITE_OTHER): Payer: 59 | Admitting: Family Medicine

## 2020-11-07 ENCOUNTER — Telehealth: Payer: Self-pay

## 2020-11-07 ENCOUNTER — Encounter: Payer: Self-pay | Admitting: Family Medicine

## 2020-11-07 VITALS — Ht 68.0 in | Wt 170.0 lb

## 2020-11-07 DIAGNOSIS — J3489 Other specified disorders of nose and nasal sinuses: Secondary | ICD-10-CM

## 2020-11-07 DIAGNOSIS — R0981 Nasal congestion: Secondary | ICD-10-CM

## 2020-11-07 DIAGNOSIS — R059 Cough, unspecified: Secondary | ICD-10-CM | POA: Diagnosis not present

## 2020-11-07 DIAGNOSIS — Z1211 Encounter for screening for malignant neoplasm of colon: Secondary | ICD-10-CM | POA: Diagnosis not present

## 2020-11-07 MED ORDER — BENZONATATE 100 MG PO CAPS
100.0000 mg | ORAL_CAPSULE | Freq: Two times a day (BID) | ORAL | 0 refills | Status: DC | PRN
Start: 2020-11-07 — End: 2021-05-30

## 2020-11-07 NOTE — Telephone Encounter (Signed)
Nurse Assessment Nurse: Kizzie Bane, RN, Marylene Land Date/Time Lamount Cohen Time): 11/07/2020 8:04:45 AM Confirm and document reason for call. If symptomatic, describe symptoms. ---Caller states she has a sinus congestion and an ear ache that started Thursday. No fever Does the patient have any new or worsening symptoms? ---Yes Will a triage be completed? ---Yes Related visit to physician within the last 2 weeks? ---No Does the PT have any chronic conditions? (i.e. diabetes, asthma, this includes High risk factors for pregnancy, etc.) ---No Is this a behavioral health or substance abuse call? ---No Guidelines Guideline Title Affirmed Question Affirmed Notes Nurse Date/Time Lamount Cohen Time) Sinus Pain or Congestion [1] Redness or swelling on the cheek, forehead or around the eye AND [2] no fever Kizzie Bane, RN, Marylene Land 11/07/2020 8:06:02 AM PLEASE NOTE: All timestamps contained within this report are represented as [place] Standard Time. CONFIDENTIALTY NOTICE: This fax transmission is intended only for the addressee. It contains information that is legally privileged, confidential or otherwise protected from use or disclosure. If you are not the intended recipient, you are strictly prohibited from reviewing, disclosing, copying using or disseminating any of this information or taking any action in reliance on or regarding this information. If you have received this fax in error, please notify us immediately by telephone so that we can arrange for its return to [place]. Phone: [phone], Toll-Free: [phone], Fax: [phone] Page: 2 of 2 Call Id: [id number] Disp. Time Lamount Cohen Time) Disposition Final User 11/07/2020 8:08:10 AM See HCP within 4 Hours (or PCP triage) Yes Kizzie Bane, RN, Rosalyn Charters Disagree/Comply Comply Caller Understands Yes PreDisposition Did not know what to do Care Advice Given Per Guideline SEE HCP (OR PCP TRIAGE) WITHIN 4 HOURS: CALL BACK IF: * You become worse CARE ADVICE given  per Sinus Pain or Congestion (Adult) guideline. Referrals REFERRED TO PCP OFFIC

## 2020-11-07 NOTE — Telephone Encounter (Signed)
Pt being seen today

## 2020-11-07 NOTE — Patient Instructions (Addendum)
Health Maintenance Due  Topic Date Due  . COLONOSCOPY (Pts 45-78yrs Insurance coverage will need to be confirmed) Discuss. She wants to get this scheduled.  Never done  . PAP SMEAR-Modifier Discuss. Does she still need this. Last on 2014 02/14/2016  . TETANUS/TDAP 03/23/2018    Depression screen [facility] 2/9 11/07/2020 09/04/2019 10/22/2017  Decreased Interest 0 0 0  Down, Depressed, Hopeless 0 0 0  PHQ - 2 Score 0 0 0  Altered sleeping 0 - -  Tired, decreased energy 0 - -  Change in appetite 0 - -  Feeling bad or failure about yourself  0 - -  Trouble concentrating 0 - -  Moving slowly or fidgety/restless 0 - -  Suicidal thoughts 0 - -  PHQ-9 Score 0 - -  Difficult doing work/chores Not difficult at all - -    Recommended follow up: No follow-ups on file.

## 2020-11-07 NOTE — Progress Notes (Signed)
Phone [phone] Virtual visit via Video note   Subjective:  Chief complaint: Chief Complaint  Patient presents with  . Cough    Tested Friday and yesterday both negative.   Marland Kitchen Headache  . Sore Throat  . sinus pressure    This visit type was conducted due to national recommendations for restrictions regarding the COVID-19 Pandemic (e.g. social distancing).  This format is felt to be most appropriate for this patient at this time balancing risks to patient and risks to population by having him in for in person visit.  No physical exam was performed (except for noted visual exam or audio findings with Telehealth visits).    Our team/I connected with Jacinta Shoe at  4:00 PM EDT by a video enabled telemedicine application (doxy.me or caregility through epic) and verified that I am speaking with the correct person using two identifiers.  Location patient: Home-O[place] Location provider: [facility], office Persons participating in the virtual visit:  patient  Our team/I discussed the limitations of evaluation and management by telemedicine and the availability of in person appointments. In light of current covid-19 pandemic, patient also understands that we are trying to protect them by minimizing in office contact if at all possible.  The patient expressed consent for telemedicine visit and agreed to proceed. Patient understands insurance will be billed.   Past Medical History-  Patient Active Problem List   Diagnosis Date Noted  . Hyperlipidemia 01/06/2018    Priority: Medium  . ADD (attention deficit disorder) 10/22/2017    Priority: Medium  . Migraines     Priority: Medium  . History of PSVT (paroxysmal supraventricular tachycardia) 03/23/2008    Priority: Low  . OA (osteoarthritis) of hip 02/26/2018    Medications- reviewed and updated Current Outpatient Medications  Medication Sig Dispense Refill  . amphetamine-dextroamphetamine (ADDERALL) 20 MG tablet Take 20 mg by mouth 2  (two) times daily.   0  . benzonatate (TESSALON) 100 MG capsule Take 1 capsule (100 mg total) by mouth 2 (two) times daily as needed for cough. 20 capsule 0  . calcium carbonate (TUMS - DOSED IN MG ELEMENTAL CALCIUM) 500 MG chewable tablet Chew 1 tablet by mouth daily.    . Multiple Vitamin (MULTIVITAMIN WITH MINERALS) TABS tablet Take 1 tablet by mouth daily.    . SUMAtriptan (IMITREX) 100 MG tablet Take 100 mg by mouth every 2 (two) hours as needed for migraine. May repeat in 2 hours if headache persists or recurs.    . IRON PO Take 1 tablet by mouth daily. (Patient not taking: Reported on 11/07/2020)    . MAGNESIUM PO Take 1 tablet by mouth daily. (Patient not taking: Reported on 11/07/2020)     No current facility-administered medications for this visit.     Objective:  Ht 5\' 8"  (1.727 m)   Wt 170 lb (77.1 kg)   BMI 25.85 kg/m  self reported vitals Gen: NAD, resting comfortably Lungs: nonlabored, normal respiratory rate  Skin: appears dry, no obvious rash     Assessment and Plan   # Cough/headache/sore throat/sinus pressure with brownish yellow discharge S:patient started with symptoms on thursday. Has had covid tests x2 Friday and yesterday negative- rapid tests.  Last Tuesday night went to Sunday concert (non masked situation)- other friends are fine. Has been a very long time sice she has had a cold. Benadryl helps some. robitussin doesn't help much. Feeling better this afternoon.   A/P: Patient with symptoms concerning for potential covid 19  but I also suspect she may have a viral sinusits- discussed indications for treatment for bacterial sinusitis which she does not yet qualify for-would call in Augmentin for double sickening including fevers at this point, symptoms lasting beyond 10 days -Vaccination status:3 vaccinations with most recent December 1st 2021 (she is going to get [place] info to add to chart) -For symptomatic control of cough we will try Tessalon-if not effective  could try codeine cough syrup before bed-sleep is the most bothersome time for her  Therefore: - testing options discussed with patient.  She is already tested negative on rapid test but I believe she needs PCR testing 1. [facility]PC testing Monday and Thursday 5 30-7 30 PM-option chosen-she will sign up online 2. Potential daytime testing 3. [email or web address]  - recommended patient watch closely for shortness of breath or confusion or worsening symptoms and if those occur he should contact us immediately  -recommended patient consider purchasing pulse oximeter and if levels 94% or below persistently- seek care at the hospital  -recommended self quarantine until negative test  at minimum   -We discussed self-isolation and if results come back positive -Hopeful results back within 48 hours of test but may take up to a week   Health Maintenance Due  Topic Date Due  . COLONOSCOPY -We will call you within two weeks about your referral to GI. If you do not hear within 2 weeks, give [place] a call.   Never done  . PAP SMEAR-needs to get updated with Dr. Ambrose Mantle 02/14/2016  . TETANUS/TDAP - consider once feeling  03/23/2018     Recommended follow up: As needed for acute concerns  Lab/Order associations:   ICD-10-CM   1. Sinus pressure  J34.89   2. Nasal congestion  R09.81   3. Cough  R05.9   4. Screen for colon cancer  Z12.11 Ambulatory referral to Gastroenterology    Meds ordered this encounter  Medications  . benzonatate (TESSALON) 100 MG capsule    Sig: Take 1 capsule (100 mg total) by mouth 2 (two) times daily as needed for cough.    Dispense:  20 capsule    Refill:  0    Time Spent: 24 minutes of total time (4:14 PM- 4:38 PM) was spent on the date of the encounter performing the following actions: chart review prior to seeing the patient, obtaining history, performing a medically necessary exam, counseling on the  treatment plan, placing orders, and documenting in our EHR.   Return precautions advised.  Tana Conch, MD

## 2020-11-08 ENCOUNTER — Ambulatory Visit: Payer: 59 | Attending: Internal Medicine

## 2020-11-08 DIAGNOSIS — Z20822 Contact with and (suspected) exposure to covid-19: Secondary | ICD-10-CM

## 2020-11-09 ENCOUNTER — Encounter: Payer: Self-pay | Admitting: Family Medicine

## 2020-11-09 LAB — SARS-COV-2, NAA 2 DAY TAT

## 2020-11-09 LAB — NOVEL CORONAVIRUS, NAA: SARS-CoV-2, NAA: NOT DETECTED

## 2020-11-09 MED ORDER — AMOXICILLIN-POT CLAVULANATE 875-125 MG PO TABS: 1.0000 | ORAL_TABLET | Freq: Two times a day (BID) | ORAL | 0 refills | Status: AC

## 2021-01-03 ENCOUNTER — Encounter: Payer: Self-pay | Admitting: Family Medicine

## 2021-01-04 MED ORDER — LORAZEPAM 0.5 MG PO TABS: 0.5000 mg | ORAL_TABLET | Freq: Two times a day (BID) | ORAL | 1 refills | Status: DC | PRN

## 2021-01-18 LAB — HM MAMMOGRAPHY

## 2021-01-19 ENCOUNTER — Encounter: Payer: Self-pay | Admitting: Family Medicine

## 2021-02-06 ENCOUNTER — Encounter: Payer: Self-pay | Admitting: Family Medicine

## 2021-02-09 ENCOUNTER — Telehealth (INDEPENDENT_AMBULATORY_CARE_PROVIDER_SITE_OTHER): Payer: 59 | Admitting: Family

## 2021-02-09 ENCOUNTER — Encounter: Payer: Self-pay | Admitting: Family

## 2021-02-09 VITALS — Ht 68.0 in | Wt 170.0 lb

## 2021-02-09 DIAGNOSIS — J019 Acute sinusitis, unspecified: Secondary | ICD-10-CM

## 2021-02-09 DIAGNOSIS — B9689 Other specified bacterial agents as the cause of diseases classified elsewhere: Secondary | ICD-10-CM | POA: Diagnosis not present

## 2021-02-09 DIAGNOSIS — J3489 Other specified disorders of nose and nasal sinuses: Secondary | ICD-10-CM | POA: Diagnosis not present

## 2021-02-09 MED ORDER — FLUTICASONE PROPIONATE 50 MCG/ACT NA SUSP: 2.0000 | Freq: Every day | NASAL | 6 refills | Status: DC

## 2021-02-09 MED ORDER — AMOXICILLIN 500 MG PO CAPS: 500.0000 mg | ORAL_CAPSULE | Freq: Three times a day (TID) | ORAL | 0 refills | Status: AC

## 2021-02-09 NOTE — Progress Notes (Signed)
Virtual Visit via Video   I connected with patient on 02/09/21 at  2:30 PM EDT by a video enabled telemedicine application and verified that I am speaking with the correct person using two identifiers.  Location patient: Home Location provider: [facility], Office Persons participating in the virtual visit: Patient, Provider, Katherine Sexton  I discussed the limitations of evaluation and management by telemedicine and the availability of in person appointments. The patient expressed understanding and agreed to proceed.  Subjective:   HPI:   Patient is in today with symptoms of cough, sinus pressure, congestion, headache x1.5 weeks.  She reports being around her grandchildren who she believes may have given her a cold.  She took a rapid antigen test this morning that was negative.  She is vaccinated and boosted.  Has been taken Tylenol and an over-the-counter cough medication without much relief.  Has a history of sinusitis.  ROS:   See pertinent positives and negatives per HPI.  Patient Active Problem List   Diagnosis Date Noted   OA (osteoarthritis) of hip 02/26/2018   Hyperlipidemia 01/06/2018   ADD (attention deficit disorder) 10/22/2017   Migraines    History of PSVT (paroxysmal supraventricular tachycardia) 03/23/2008    Social History   Tobacco Use   Smoking status: Never   Smokeless tobacco: Never  Substance Use Topics   Alcohol use: Yes    Alcohol/week: 3.0 - 4.0 standard drinks    Types: 3 - 4 Glasses of wine per week    Comment: Occassionally ;     Current Outpatient Medications:    amoxicillin (AMOXIL) 500 MG capsule, Take 1 capsule (500 mg total) by mouth 3 (three) times daily for 10 days., Disp: 30 capsule, Rfl: 0   amphetamine-dextroamphetamine (ADDERALL) 20 MG tablet, Take 20 mg by mouth 2 (two) times daily. , Disp: , Rfl: 0   calcium carbonate (TUMS - DOSED IN MG ELEMENTAL CALCIUM) 500 MG chewable tablet, Chew 1 tablet by mouth daily., Disp: ,  Rfl:    fluticasone (FLONASE) 50 MCG/ACT nasal spray, Place 2 sprays into both nostrils daily., Disp: 16 g, Rfl: 6   LORazepam (ATIVAN) 0.5 MG tablet, Take 1 tablet (0.5 mg total) by mouth 2 (two) times daily as needed for anxiety (do not drive for 8 hours after taking)., Disp: 30 tablet, Rfl: 1   Multiple Vitamin (MULTIVITAMIN WITH MINERALS) TABS tablet, Take 1 tablet by mouth daily., Disp: , Rfl:    SUMAtriptan (IMITREX) 100 MG tablet, Take 100 mg by mouth every 2 (two) hours as needed for migraine. May repeat in 2 hours if headache persists or recurs., Disp: , Rfl:    benzonatate (TESSALON) 100 MG capsule, Take 1 capsule (100 mg total) by mouth 2 (two) times daily as needed for cough. (Patient not taking: Reported on 02/09/2021), Disp: 20 capsule, Rfl: 0   IRON PO, Take 1 tablet by mouth daily. (Patient not taking: No sig reported), Disp: , Rfl:    MAGNESIUM PO, Take 1 tablet by mouth daily. (Patient not taking: No sig reported), Disp: , Rfl:   No Known Allergies  Objective:   Ht 5\' 8"  (1.727 m)   Wt 169 lb 15.6 oz (77.1 kg)   BMI 25.84 kg/m   Patient is well-developed, well-nourished in no acute distress.  Resting comfortably at home.  Head is normocephalic, atraumatic.  No labored breathing.  Speech is clear and coherent with logical content.  Patient is alert and oriented at baseline.    Assessment  and Plan:   Katherine Sexton was seen today for cough and headache.  Diagnoses and all orders for this visit:  Acute bacterial sinusitis  Sinus pressure  Other orders -     fluticasone (FLONASE) 50 MCG/ACT nasal spray; Place 2 sprays into both nostrils daily. -     amoxicillin (AMOXIL) 500 MG capsule; Take 1 capsule (500 mg total) by mouth 3 (three) times daily for 10 days.   Call the office if symptoms worsen or persist.  Rest.  Drink plenty of fluids.  Follow-up as scheduled and sooner as needed.  Eulis Foster, FNP 02/09/2021

## 2021-05-29 ENCOUNTER — Encounter: Payer: Self-pay | Admitting: Family Medicine

## 2021-05-30 ENCOUNTER — Encounter: Payer: Self-pay | Admitting: Physician Assistant

## 2021-05-30 ENCOUNTER — Telehealth (INDEPENDENT_AMBULATORY_CARE_PROVIDER_SITE_OTHER): Payer: 59 | Admitting: Physician Assistant

## 2021-05-30 VITALS — BP 140/95

## 2021-05-30 DIAGNOSIS — R030 Elevated blood-pressure reading, without diagnosis of hypertension: Secondary | ICD-10-CM | POA: Diagnosis not present

## 2021-05-30 DIAGNOSIS — R051 Acute cough: Secondary | ICD-10-CM

## 2021-05-30 MED ORDER — AMOXICILLIN-POT CLAVULANATE 875-125 MG PO TABS: 1.0000 | ORAL_TABLET | Freq: Two times a day (BID) | ORAL | 0 refills | Status: DC

## 2021-05-30 NOTE — Progress Notes (Signed)
Virtual Visit via Video   I connected with Katherine Sexton on 05/30/21 at  9:30 AM EST by a video enabled telemedicine application and verified that I am speaking with the correct person using two identifiers. Location patient: Home Location provider: [facility], Office Persons participating in the virtual visit: Brigit Doke, [name] PA-C  I discussed the limitations of evaluation and management by telemedicine and the availability of in person appointments. The patient expressed understanding and agreed to proceed.  Subjective:   HPI:   Cough Husband has had a cold for 1 month. Her grandsons are also sick.  She has had pressure in sinuses, stuffy ears. Has had symptoms x 5 days. Slight cough. Home COVID test negative. She has not taken anything for her symptoms. Denies: SOB, chest pain, worst HA of life.  Elevated blood pressure reading Currently taking no medication. At home blood pressure readings are: 140/95 today. Patient denies chest pain, SOB, blurred vision, dizziness, unusual headaches, lower leg swelling. Denies excessive caffeine intake, excessive alcohol intake, or increase in salt consumption.  BP Readings from Last 3 Encounters:  05/30/21 (!) 140/95  02/28/18 130/69  02/19/18 124/82     ROS: See pertinent positives and negatives per HPI.  Patient Active Problem List   Diagnosis Date Noted   OA (osteoarthritis) of hip 02/26/2018   Hyperlipidemia 01/06/2018   ADD (attention deficit disorder) 10/22/2017   Migraines    History of PSVT (paroxysmal supraventricular tachycardia) 03/23/2008    Social History   Tobacco Use   Smoking status: Never   Smokeless tobacco: Never  Substance Use Topics   Alcohol use: Yes    Alcohol/week: 3.0 - 4.0 standard drinks    Types: 3 - 4 Glasses of wine per week    Comment: Occassionally ;     Current Outpatient Medications:    amoxicillin-clavulanate (AUGMENTIN) 875-125 MG tablet, Take 1 tablet by mouth 2 (two) times  daily., Disp: 20 tablet, Rfl: 0   amphetamine-dextroamphetamine (ADDERALL) 20 MG tablet, Take 20 mg by mouth 2 (two) times daily. , Disp: , Rfl: 0   calcium carbonate (TUMS - DOSED IN MG ELEMENTAL CALCIUM) 500 MG chewable tablet, Chew 1 tablet by mouth daily., Disp: , Rfl:    LORazepam (ATIVAN) 0.5 MG tablet, Take 1 tablet (0.5 mg total) by mouth 2 (two) times daily as needed for anxiety (do not drive for 8 hours after taking)., Disp: 30 tablet, Rfl: 1   Multiple Vitamin (MULTIVITAMIN WITH MINERALS) TABS tablet, Take 1 tablet by mouth daily., Disp: , Rfl:    SUMAtriptan (IMITREX) 100 MG tablet, Take 100 mg by mouth every 2 (two) hours as needed for migraine. May repeat in 2 hours if headache persists or recurs., Disp: , Rfl:   No Known Allergies  Objective:   VITALS: Per patient if applicable, see vitals. GENERAL: Alert, appears well and in no acute distress. HEENT: Atraumatic, conjunctiva clear, no obvious abnormalities on inspection of external nose and ears. NECK: Normal movements of the head and neck. CARDIOPULMONARY: No increased WOB. Speaking in clear sentences. I:E ratio WNL.  MS: Moves all visible extremities without noticeable abnormality. PSYCH: Pleasant and cooperative, well-groomed. Speech normal rate and rhythm. Affect is appropriate. Insight and judgement are appropriate. Attention is focused, linear, and appropriate.  NEURO: CN grossly intact. Oriented as arrived to appointment on time with no prompting. Moves both UE equally.  SKIN: No obvious lesions, wounds, erythema, or cyanosis noted on face or hands.  Assessment and Plan:  Diagnoses and all orders for this visit:  Acute cough No red flags on discussion, patient is not in any obvious distress during our visit. Discussed progression of most viral illness, and recommended supportive care at this point in time. I did however provide pocket rx for oral augmentin should symptoms not improve as anticipated. Discussed over  the counter supportive care options, with recommendations to push fluids and rest. Reviewed return precautions including new/worsening fever, SOB, new/worsening cough or other concerns.  Recommended need to self-quarantine and practice social distancing until symptoms resolve. Discussed current recommendations for COVID testing. I recommend that patient follow-up if symptoms worsen or persist despite treatment x 7-10 days, sooner if needed.  Elevated blood pressure reading Asymptomatic I did recommend she continue to closely monitor this, especially since she takes a stimulant Follow-up with PCP or me as needed for this  Other orders -     amoxicillin-clavulanate (AUGMENTIN) 875-125 MG tablet; Take 1 tablet by mouth 2 (two) times daily.   I discussed the assessment and treatment plan with the patient. The patient was provided an opportunity to ask questions and all were answered. The patient agreed with the plan and demonstrated an understanding of the instructions.   The patient was advised to call back or seek an in-person evaluation if the symptoms worsen or if the condition fails to improve as anticipated.   [place], [place] 05/30/2021

## 2021-07-11 ENCOUNTER — Telehealth: Payer: Self-pay | Admitting: Family Medicine

## 2021-07-11 NOTE — Telephone Encounter (Signed)
Pt called and said that she tested positive for covid last Tuesday 07/04/21 and that she has been symptom free since Sunday 07/09/21 but that she is still testing positive and wanted to know if hse is good to be around her grandkids. Please advise. Call back [phone].

## 2021-07-11 NOTE — Telephone Encounter (Signed)
Please advise 

## 2021-07-11 NOTE — Telephone Encounter (Signed)
Patient notified and verbalized understanding. 

## 2022-03-07 ENCOUNTER — Encounter: Payer: Self-pay | Admitting: Family Medicine

## 2022-03-07 ENCOUNTER — Telehealth (INDEPENDENT_AMBULATORY_CARE_PROVIDER_SITE_OTHER): Payer: 59 | Admitting: Family Medicine

## 2022-03-07 VITALS — Ht 68.0 in | Wt 170.0 lb

## 2022-03-07 DIAGNOSIS — B029 Zoster without complications: Secondary | ICD-10-CM

## 2022-03-07 NOTE — Progress Notes (Signed)
Phone [phone] Virtual visit via Video note   Subjective:  Chief complaint: Chief Complaint  Patient presents with   Rash    Pt c/o rash that could possibly be shingles (see mychart message) with pain and itching on her side and went to an [facility] valcylovir hcl 1g tid that seems to be helping. She still has pain underneath skin and has developed cough and still testing negative.    This visit type was conducted due to national recommendations for restrictions regarding the COVID-19 Pandemic (e.g. social distancing).  This format is felt to be most appropriate for this patient at this time balancing risks to patient and risks to population by having him in for in person visit.  No physical exam was performed (except for noted visual exam or audio findings with Telehealth visits).    Our team/I connected with Jacinta Shoe at 11:40 AM EDT by a video enabled telemedicine application (doxy.me or caregility through epic) and verified that I am speaking with the correct person using two identifiers.  Location patient: Home[place] Location provider: [facility], office Persons participating in the virtual visit:  patient  Our team/I discussed the limitations of evaluation and management by telemedicine and the availability of in person appointments. In light of current covid-19 pandemic, patient also understands that we are trying to protect them by minimizing in office contact if at all possible.  The patient expressed consent for telemedicine visit and agreed to proceed. Patient understands insurance will be billed.   Past Medical History-  Patient Active Problem List   Diagnosis Date Noted   Hyperlipidemia 01/06/2018    Priority: Medium    ADD (attention deficit disorder) 10/22/2017    Priority: Medium    Migraines     Priority: Medium    History of PSVT (paroxysmal supraventricular tachycardia) 03/23/2008    Priority: Low   OA (osteoarthritis) of hip 02/26/2018    Medications- reviewed and  updated Current Outpatient Medications  Medication Sig Dispense Refill   amphetamine-dextroamphetamine (ADDERALL) 20 MG tablet Take 20 mg by mouth 2 (two) times daily.   0   calcium carbonate (TUMS - DOSED IN MG ELEMENTAL CALCIUM) 500 MG chewable tablet Chew 1 tablet by mouth daily.     LORazepam (ATIVAN) 0.5 MG tablet Take 1 tablet (0.5 mg total) by mouth 2 (two) times daily as needed for anxiety (do not drive for 8 hours after taking). 30 tablet 1   Multiple Vitamin (MULTIVITAMIN WITH MINERALS) TABS tablet Take 1 tablet by mouth daily.     SUMAtriptan (IMITREX) 100 MG tablet Take 100 mg by mouth every 2 (two) hours as needed for migraine. May repeat in 2 hours if headache persists or recurs.     No current facility-administered medications for this visit.     Objective:  Ht 5\' 8"  (1.727 m)   Wt 170 lb (77.1 kg)   BMI 25.85 kg/m  self reported vitals Gen: NAD, resting comfortably Lungs: nonlabored, normal respiratory rate  Skin: appears dry, no obvious rash     Assessment and Plan   # Shingles S:patient was diagnosed with shingles (no vaccination) and started on an antiviral valtrex 1g TID which she has finished I believe and topical steroid cream in [place] on 02/24/22. Started out with pain on her right side and had burning/blisters noted.   Despite treatment, in recent days- Sleeping a lot more with this.  Also has had sore throat and some headaches and some cough- started with symptoms  on Monday morning. husband with covid but has tested negative for covid  a day after symptom onset (had tested on Sunday and was negative). Not short of breath A/P: I strongly suspect she may have COVID-19 (vaccinations x3 with last in 2021 but also had COVID December 2022) in addition to recovering from shingles-encouraged her to retest tomorrow and let me know-for now she leaning toward not treating with antiviral but we can reopen discussion if she tests positive.  She did have COVID 8  months ago and possible her symptoms are mounted immune response to COVID and will not test negative, suspicion is she still may test positive   Recommended follow up: As needed for acute symptoms-also test for COVID again tomorrow and let me know if positive  Lab/Order associations:   ICD-10-CM   1. Herpes zoster without complication  B02.9       Return precautions advised.  Tana Conch, MD

## 2022-03-08 ENCOUNTER — Encounter: Payer: Self-pay | Admitting: Family Medicine

## 2022-03-19 ENCOUNTER — Encounter: Payer: Self-pay | Admitting: Family Medicine

## 2022-03-20 MED ORDER — BENZONATATE 100 MG PO CAPS: 100.0000 mg | ORAL_CAPSULE | Freq: Two times a day (BID) | ORAL | 0 refills | Status: DC | PRN

## 2022-12-25 DIAGNOSIS — G43019 Migraine without aura, intractable, without status migrainosus: Secondary | ICD-10-CM | POA: Diagnosis not present

## 2022-12-25 NOTE — Progress Notes (Signed)
Katherine Sexton is a 67 y.o. female here for a new problem.  History of Present Illness:   No chief complaint on file.   HPI  Lower Respiratory Infection   Past Medical History:  Diagnosis Date   Anxiety    Chicken pox    childhood    Hip bursitis    hip bursitis   Migraines    Dr. Neale Burly of headache clinic. Started with Dr. Meryl Crutch years ago. Sumatriptan about 2x a week when stressed.    PSVT (paroxysmal supraventricular tachycardia) (HCC)    1993. provoked by atypical diet. no issues since then. 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 not sure what type   COPD Mother        long term smoker   Stroke Mother        48. TIA a few years prior   Congestive Heart Failure Mother        78   Hypertension Father    Heart disease Father        CABG- mid 55s   Renal cancer Father        early 10s   Pulmonary fibrosis Father        lead to his death   Colon cancer Sister    Breast cancer Daughter    Healthy Son    Lung cancer Maternal Grandmother    Heart disease Paternal Grandmother    Heart attack Paternal Grandfather        died in 24s   Diabetes Neg Hx     Esophageal cancer Neg Hx    Gallbladder disease Neg Hx     No Known Allergies  Current Medications:   Current Outpatient Medications:    amphetamine-dextroamphetamine (ADDERALL) 20 MG tablet, Take 20 mg by mouth 2 (two) times daily. , Disp: , Rfl: 0   benzonatate (TESSALON) 100 MG capsule, Take 1 capsule (100 mg total) by mouth 2 (two) times daily as needed for cough., Disp: 20 capsule, Rfl: 0   calcium carbonate (TUMS - DOSED IN MG ELEMENTAL CALCIUM) 500 MG chewable tablet, Chew 1 tablet by mouth daily., Disp: , Rfl:    LORazepam (ATIVAN) 0.5 MG tablet, Take 1 tablet (0.5 mg total) by mouth 2 (two) times daily as needed for anxiety (do not drive for 8 hours after taking)., Disp: 30 tablet, Rfl: 1   Multiple Vitamin (MULTIVITAMIN WITH MINERALS) TABS tablet, Take 1 tablet by mouth daily., Disp: , Rfl:  SUMAtriptan (IMITREX) 100 MG tablet, Take 100 mg by mouth every 2 (two) hours as needed for migraine. May repeat in 2 hours if headache persists or recurs., Disp: , Rfl:    Review of Systems:   ROS  Vitals:   There were no vitals filed for this visit.   There is no height or weight on file to calculate BMI.  Physical Exam:   Physical Exam  Assessment and Plan:   ***   I,Alexander Ruley,acting as a scribe for [name], PA.,have documented all relevant documentation on the behalf of [name], PA,as directed by  [name], PA while in the presence of [name], [place].   ***   [name], PA-C

## 2022-12-26 ENCOUNTER — Ambulatory Visit (INDEPENDENT_AMBULATORY_CARE_PROVIDER_SITE_OTHER): Payer: Medicare HMO | Admitting: Physician Assistant

## 2022-12-26 VITALS — BP 120/70 | HR 87 | Temp 98.2°F | Ht 68.0 in | Wt 178.0 lb

## 2022-12-26 DIAGNOSIS — R051 Acute cough: Secondary | ICD-10-CM | POA: Diagnosis not present

## 2022-12-26 MED ORDER — AMOXICILLIN-POT CLAVULANATE 875-125 MG PO TABS: 1.0000 | ORAL_TABLET | Freq: Two times a day (BID) | ORAL | 0 refills | Status: DC

## 2022-12-26 NOTE — Patient Instructions (Signed)
It was great to see you!  Start oral Augmentin  Trial 12-hour delsym over the counter cough syrup (generic is fine!)   If any new/worsening symptom(s) - let us know   Take care,  [name] PA-C

## 2022-12-31 ENCOUNTER — Encounter: Payer: Self-pay | Admitting: Physician Assistant

## 2022-12-31 NOTE — Telephone Encounter (Signed)
Please advise 

## 2023-01-06 ENCOUNTER — Encounter: Payer: Self-pay | Admitting: Family Medicine

## 2023-01-11 ENCOUNTER — Ambulatory Visit (HOSPITAL_COMMUNITY): Payer: Self-pay

## 2023-01-20 ENCOUNTER — Encounter: Payer: Self-pay | Admitting: Family Medicine

## 2023-01-21 NOTE — Telephone Encounter (Signed)
FYI

## 2023-01-22 ENCOUNTER — Encounter: Payer: Self-pay | Admitting: Physician Assistant

## 2023-01-22 ENCOUNTER — Ambulatory Visit (INDEPENDENT_AMBULATORY_CARE_PROVIDER_SITE_OTHER): Payer: Medicare HMO | Admitting: Physician Assistant

## 2023-01-22 VITALS — BP 100/70 | HR 90 | Temp 97.7°F | Ht 68.0 in | Wt 171.0 lb

## 2023-01-22 DIAGNOSIS — M2550 Pain in unspecified joint: Secondary | ICD-10-CM

## 2023-01-22 DIAGNOSIS — J029 Acute pharyngitis, unspecified: Secondary | ICD-10-CM

## 2023-01-22 LAB — POC COVID19 BINAXNOW: SARS Coronavirus 2 Ag: NEGATIVE

## 2023-01-22 LAB — POCT RAPID STREP A (OFFICE): Rapid Strep A Screen: NEGATIVE

## 2023-01-22 MED ORDER — NAPROXEN SODIUM 550 MG PO TABS: 550.0000 mg | ORAL_TABLET | Freq: Two times a day (BID) | ORAL | 0 refills | Status: DC

## 2023-01-22 NOTE — Progress Notes (Signed)
Katherine Sexton is a 67 y.o. female here for a new problem.  History of Present Illness:   Chief Complaint  Patient presents with   Sore Throat    Pt c/o sore throat since Thurs 7/4 while at the beach.   Joint Swelling    Pt c/o joint pain and swelling started on Saturday, ? Tick bite 2 weeks ago.     Sore throat: She complains of sore throat since Thursday, 01/17/2023.  She was seen for acute cough on 12/26/2022 and reports her symptoms improved following that visit but returned while vacationing at the beach.  She has taken tylenol to manage her symptoms.  Her other family members are not showing similar symptoms.   Joint swelling and pain:  She complains of joint pain and swelling since Saturday, 01/19/2023.  Her symptoms have improved since then.  She has pain in both shoulders, both knees, and in her toes.  She denies any stiffness in her joints, neck stiffness/ridgidity, diarrhea, rash or excessive activities while vacationing. 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  She had no reoccurring pain in her scalp since then.  Had an episode of chills but had not had any reoccurrence recently.  She has taken aleve to manage her symptoms.    Past Medical History:  Diagnosis Date   Anxiety    Chicken pox    childhood    Hip bursitis    hip bursitis   Migraines    Dr. Neale Burly of headache clinic. Started with Dr. Meryl Crutch years ago. Sumatriptan about 2x a week when stressed.    PSVT (paroxysmal supraventricular tachycardia)    1993. provoked by atypical diet. no issues since then. 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 not sure what type   COPD Mother        long term smoker   Stroke Mother        62. TIA a few years prior   Congestive Heart Failure Mother        65   Hypertension Father    Heart disease Father        CABG- mid 61s   Renal cancer Father        early 71s   Pulmonary fibrosis Father        lead to his death   Colon cancer Sister    Breast cancer Daughter    Healthy Son  Lung cancer Maternal Grandmother    Heart disease Paternal Grandmother    Heart attack Paternal Grandfather        died in 71s   Diabetes Neg Hx    Esophageal cancer Neg Hx    Gallbladder disease Neg Hx     No Known Allergies  Current Medications:   Current Outpatient Medications:    acetaminophen (TYLENOL) 325 MG tablet, Take 650 mg by mouth every 6 (six) hours as needed., Disp: , Rfl:    amphetamine-dextroamphetamine (ADDERALL) 20 MG tablet, Take 20 mg by mouth 2 (two) times daily. , Disp: , Rfl: 0   naproxen sodium (ALEVE) 220 MG tablet, Take 220 mg by mouth 2 (two) times daily as needed., Disp: , Rfl:    naproxen sodium (ANAPROX DS) 550 MG tablet, Take 1 tablet (550 mg total) by mouth 2 (two) times daily with a meal., Disp: 30 tablet, Rfl: 0   SUMAtriptan (IMITREX) 100 MG tablet, Take 100 mg by mouth every 2 (two) hours as needed for migraine. May repeat in 2 hours if headache persists or recurs., Disp: , Rfl:    Review of Systems:   Review of Systems  Constitutional:  Negative for chills.  HENT:   Positive for sore throat.   Gastrointestinal:  Negative for diarrhea.  Musculoskeletal:  Positive for joint pain (shoulders, knees, toes).       (-)stiff joints (+)swelling in toes, fingers, knees, shoulders  Skin:  Negative for rash.       (-)scalp pain    Vitals:   Vitals:   01/22/23 1034  BP: 100/70  Pulse: 90  Temp: 97.7 F (36.5 C)  TempSrc: Temporal  SpO2: 97%  Weight: 171 lb (77.6 kg)  Height: 5\' 8"  (1.727 m)     Body mass index is 26 kg/m.  Physical Exam:   Physical Exam Vitals and nursing note reviewed.  Constitutional:      General: She is not in acute distress.    Appearance: She is well-developed. She is not ill-appearing or toxic-appearing.  HENT:     Head: Normocephalic and atraumatic.     Right Ear: Tympanic membrane, ear canal and external ear normal. Tympanic membrane is not erythematous, retracted or bulging.     Left Ear: Tympanic membrane, ear canal and external ear normal. Tympanic membrane is not erythematous, retracted or bulging.     Nose: Nose normal.     Right Sinus: No maxillary sinus tenderness or frontal sinus tenderness.     Left Sinus: No maxillary sinus tenderness or frontal sinus tenderness.     Mouth/Throat:     Pharynx: Uvula midline. Posterior oropharyngeal erythema present.     Tonsils: No tonsillar exudate.  Eyes:     General: Lids are normal.     Conjunctiva/sclera: Conjunctivae normal.  Neck:     Trachea: Trachea normal.  Cardiovascular:     Rate and Rhythm: Normal rate and regular rhythm.     Heart sounds: Normal heart sounds, S1 normal and S2 normal.  Pulmonary:     Effort: Pulmonary effort is normal.     Breath sounds: Normal breath sounds. No decreased breath sounds, wheezing, rhonchi or rales.     Comments: Tested negative for Covid-19 and Strept throat in office Musculoskeletal:     Comments: Slight erythema and swelling to right metacarpophalangeal joint and metatarsophalangeal joint with normal range of motion    Lymphadenopathy:     Cervical: Cervical adenopathy present.     Right cervical:  Superficial cervical adenopathy present.     Left cervical: Superficial cervical adenopathy present.  Skin:    General: Skin is warm and dry.  Neurological:     Mental Status: She is alert.  Psychiatric:        Speech: Speech normal.        Behavior: Behavior normal. Behavior is cooperative.    Results for orders placed or performed in visit on 01/22/23  POC COVID-19  Result Value Ref Range   SARS Coronavirus 2 Ag Negative Negative  POCT rapid strep A  Result Value Ref Range   Rapid Strep A Screen Negative Negative     Assessment and Plan:   Sore throat No red flags on exam.  Strep and COVID tests are negative. Continue supportive care. Reviewed return precautions including worsening fever, SOB, worsening cough or other concerns. Push fluids and rest. I recommend that patient follow-up if symptoms worsen or persist despite treatment x 7-10 days, sooner if needed.  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  I, [name], [place], have reviewed all documentation for this visit. The documentation on 01/22/23 for the exam, diagnosis, procedures, and orders are all accurate and complete.   [name], PA-C

## 2023-01-22 NOTE — Patient Instructions (Addendum)
It was great to see you!  Take the naproxen 550 mg twice daily with food for a few days  If symptoms do not improve OR if any worsening, please reach out and we will get blood work and/or imaging.  Take care,  [name] PA-C

## 2023-01-23 ENCOUNTER — Encounter: Payer: Self-pay | Admitting: Physician Assistant

## 2023-01-23 ENCOUNTER — Other Ambulatory Visit: Payer: Self-pay | Admitting: Physician Assistant

## 2023-01-23 ENCOUNTER — Other Ambulatory Visit (INDEPENDENT_AMBULATORY_CARE_PROVIDER_SITE_OTHER): Payer: Medicare HMO | Admitting: Physician Assistant

## 2023-01-23 ENCOUNTER — Telehealth: Payer: Medicare HMO | Admitting: Physician Assistant

## 2023-01-23 VITALS — BP 132/80 | HR 103 | Temp 97.7°F | Ht 68.0 in | Wt 171.0 lb

## 2023-01-23 DIAGNOSIS — M2550 Pain in unspecified joint: Secondary | ICD-10-CM

## 2023-01-23 DIAGNOSIS — N644 Mastodynia: Secondary | ICD-10-CM

## 2023-01-23 LAB — COMPREHENSIVE METABOLIC PANEL
ALT: 8 U/L (ref 0–35)
AST: 10 U/L (ref 0–37)
Albumin: 3.7 g/dL (ref 3.5–5.2)
Alkaline Phosphatase: 84 U/L (ref 39–117)
BUN: 12 mg/dL (ref 6–23)
CO2: 27 mEq/L (ref 19–32)
Calcium: 9.5 mg/dL (ref 8.4–10.5)
Chloride: 96 mEq/L (ref 96–112)
Creatinine, Ser: 0.69 mg/dL (ref 0.40–1.20)
GFR: 90.89 mL/min (ref >60.00)
Glucose, Bld: 116 mg/dL — ABNORMAL HIGH (ref 70–99)
Potassium: 3.9 mEq/L (ref 3.5–5.1)
Sodium: 133 mEq/L — ABNORMAL LOW (ref 135–145)
Total Bilirubin: 0.4 mg/dL (ref 0.2–1.2)
Total Protein: 7.1 g/dL (ref 6.0–8.3)

## 2023-01-23 LAB — URINALYSIS, ROUTINE W REFLEX MICROSCOPIC
Bilirubin Urine: NEGATIVE
Hgb urine dipstick: NEGATIVE
Ketones, ur: NEGATIVE
Nitrite: NEGATIVE
Specific Gravity, Urine: <=1.005 — AB (ref 1.000–1.030)
Total Protein, Urine: NEGATIVE
Urine Glucose: NEGATIVE
Urobilinogen, UA: 0.2 (ref 0.0–1.0)
pH: 7 (ref 5.0–8.0)

## 2023-01-23 LAB — URIC ACID: Uric Acid, Serum: 3.9 mg/dL (ref 2.4–7.0)

## 2023-01-23 LAB — CBC WITH DIFFERENTIAL/PLATELET
Basophils Absolute: 0.1 10*3/uL (ref 0.0–0.1)
Basophils Relative: 0.5 % (ref 0.0–3.0)
Eosinophils Absolute: 0.6 10*3/uL (ref 0.0–0.7)
Eosinophils Relative: 5.1 % — ABNORMAL HIGH (ref 0.0–5.0)
HCT: 35.4 % — ABNORMAL LOW (ref 36.0–46.0)
Hemoglobin: 11.5 g/dL — ABNORMAL LOW (ref 12.0–15.0)
Lymphocytes Relative: 9.8 % — ABNORMAL LOW (ref 12.0–46.0)
Lymphs Abs: 1.2 10*3/uL (ref 0.7–4.0)
MCHC: 32.6 g/dL (ref 30.0–36.0)
MCV: 94.4 fl (ref 78.0–100.0)
Monocytes Absolute: 0.6 10*3/uL (ref 0.1–1.0)
Monocytes Relative: 5.1 % (ref 3.0–12.0)
Neutro Abs: 9.5 10*3/uL — ABNORMAL HIGH (ref 1.4–7.7)
Neutrophils Relative %: 79.5 % — ABNORMAL HIGH (ref 43.0–77.0)
Platelets: 767 10*3/uL — ABNORMAL HIGH (ref 150.0–400.0)
RBC: 3.75 Mil/uL — ABNORMAL LOW (ref 3.87–5.11)
RDW: 14.1 % (ref 11.5–15.5)
WBC: 11.9 10*3/uL — ABNORMAL HIGH (ref 4.0–10.5)

## 2023-01-23 LAB — C-REACTIVE PROTEIN: CRP: 10.8 mg/dL (ref 0.5–20.0)

## 2023-01-23 LAB — SEDIMENTATION RATE: Sed Rate: 88 mm/hr — ABNORMAL HIGH (ref 0–30)

## 2023-01-23 LAB — CK: Total CK: 28 U/L (ref 7–177)

## 2023-01-23 NOTE — Telephone Encounter (Signed)
Spoke to pt told her Katherine Sexton has placed orders for lab work and you just need a lab appt. Pt verbalized understanding. Appt scheduled for today at 9:45. Told her I will cancel virtual visit. Pt verbalized understanding.

## 2023-01-23 NOTE — Patient Instructions (Signed)
It was great to see you!  I called [facility] -- they are going to call you as soon as they get my orders.   I will be in touch with all results  Take care,  [name] PA-C
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
## 2023-01-24 ENCOUNTER — Other Ambulatory Visit: Payer: Self-pay | Admitting: Physician Assistant

## 2023-01-24 DIAGNOSIS — D75839 Thrombocytosis, unspecified: Secondary | ICD-10-CM

## 2023-01-24 LAB — TIQ-NTM

## 2023-01-25 ENCOUNTER — Other Ambulatory Visit (INDEPENDENT_AMBULATORY_CARE_PROVIDER_SITE_OTHER): Payer: Medicare HMO

## 2023-01-25 ENCOUNTER — Encounter: Payer: Self-pay | Admitting: Physician Assistant

## 2023-01-25 ENCOUNTER — Other Ambulatory Visit: Payer: Self-pay

## 2023-01-25 ENCOUNTER — Encounter (HOSPITAL_BASED_OUTPATIENT_CLINIC_OR_DEPARTMENT_OTHER): Payer: Self-pay

## 2023-01-25 ENCOUNTER — Emergency Department (HOSPITAL_BASED_OUTPATIENT_CLINIC_OR_DEPARTMENT_OTHER): Payer: Medicare HMO | Admitting: Radiology

## 2023-01-25 ENCOUNTER — Emergency Department (HOSPITAL_BASED_OUTPATIENT_CLINIC_OR_DEPARTMENT_OTHER)
Admission: EM | Admit: 2023-01-25 | Discharge: 2023-01-25 | Disposition: A | Discharge status: Home / Self Care | Payer: Medicare HMO | Attending: Emergency Medicine | Admitting: Emergency Medicine

## 2023-01-25 ENCOUNTER — Ambulatory Visit (INDEPENDENT_AMBULATORY_CARE_PROVIDER_SITE_OTHER): Payer: Medicare HMO | Admitting: Physician Assistant

## 2023-01-25 VITALS — BP 160/100 | HR 102 | Temp 97.7°F | Ht 68.0 in | Wt 173.0 lb

## 2023-01-25 DIAGNOSIS — K209 Esophagitis, unspecified without bleeding: Secondary | ICD-10-CM | POA: Diagnosis not present

## 2023-01-25 DIAGNOSIS — D75839 Thrombocytosis, unspecified: Secondary | ICD-10-CM | POA: Diagnosis not present

## 2023-01-25 DIAGNOSIS — J90 Pleural effusion, not elsewhere classified: Secondary | ICD-10-CM | POA: Diagnosis not present

## 2023-01-25 DIAGNOSIS — M2550 Pain in unspecified joint: Secondary | ICD-10-CM | POA: Diagnosis not present

## 2023-01-25 DIAGNOSIS — J189 Pneumonia, unspecified organism: Secondary | ICD-10-CM | POA: Insufficient documentation

## 2023-01-25 DIAGNOSIS — R000 Tachycardia, unspecified: Secondary | ICD-10-CM | POA: Diagnosis not present

## 2023-01-25 DIAGNOSIS — Z1152 Encounter for screening for COVID-19: Secondary | ICD-10-CM | POA: Diagnosis not present

## 2023-01-25 DIAGNOSIS — J029 Acute pharyngitis, unspecified: Secondary | ICD-10-CM | POA: Diagnosis not present

## 2023-01-25 DIAGNOSIS — J168 Pneumonia due to other specified infectious organisms: Secondary | ICD-10-CM | POA: Diagnosis not present

## 2023-01-25 DIAGNOSIS — R918 Other nonspecific abnormal finding of lung field: Secondary | ICD-10-CM | POA: Diagnosis not present

## 2023-01-25 LAB — CBC WITH DIFFERENTIAL/PLATELET
Abs Immature Granulocytes: 0.07 10*3/uL (ref 0.00–0.07)
Basophils Absolute: 0.1 10*3/uL (ref 0.0–0.1)
Basophils Absolute: 0.1 10*3/uL (ref 0.0–0.1)
Basophils Relative: 0.6 % (ref 0.0–3.0)
Basophils Relative: 1 %
Eosinophils Absolute: 0.9 10*3/uL — ABNORMAL HIGH (ref 0.0–0.7)
Eosinophils Absolute: 0.8 10*3/uL — ABNORMAL HIGH (ref 0.0–0.5)
Eosinophils Relative: 7.3 % — ABNORMAL HIGH (ref 0.0–5.0)
Eosinophils Relative: 7 %
HCT: 34.9 % — ABNORMAL LOW (ref 36.0–46.0)
HCT: 34.4 % — ABNORMAL LOW (ref 36.0–46.0)
Hemoglobin: 11.4 g/dL — ABNORMAL LOW (ref 12.0–15.0)
Hemoglobin: 11.2 g/dL — ABNORMAL LOW (ref 12.0–15.0)
Immature Granulocytes: 1 %
Lymphocytes Relative: 11.2 % — ABNORMAL LOW (ref 12.0–46.0)
Lymphocytes Relative: 9 %
Lymphs Abs: 1.3 10*3/uL (ref 0.7–4.0)
Lymphs Abs: 1.1 10*3/uL (ref 0.7–4.0)
MCH: 30.3 pg (ref 26.0–34.0)
MCHC: 32.6 g/dL (ref 30.0–36.0)
MCHC: 32.6 g/dL (ref 30.0–36.0)
MCV: 93.5 fl (ref 78.0–100.0)
MCV: 93 fL (ref 80.0–100.0)
Monocytes Absolute: 0.9 10*3/uL (ref 0.1–1.0)
Monocytes Absolute: 0.8 10*3/uL (ref 0.1–1.0)
Monocytes Relative: 7.9 % (ref 3.0–12.0)
Monocytes Relative: 7 %
Neutro Abs: 8.6 10*3/uL — ABNORMAL HIGH (ref 1.4–7.7)
Neutro Abs: 9.3 10*3/uL — ABNORMAL HIGH (ref 1.7–7.7)
Neutrophils Relative %: 73 % (ref 43.0–77.0)
Neutrophils Relative %: 75 %
Platelets: 744 10*3/uL — ABNORMAL HIGH (ref 150.0–400.0)
Platelets: 681 10*3/uL — ABNORMAL HIGH (ref 150–400)
RBC: 3.73 Mil/uL — ABNORMAL LOW (ref 3.87–5.11)
RBC: 3.7 MIL/uL — ABNORMAL LOW (ref 3.87–5.11)
RDW: 14.1 % (ref 11.5–15.5)
RDW: 13.1 % (ref 11.5–15.5)
WBC: 11.8 10*3/uL — ABNORMAL HIGH (ref 4.0–10.5)
WBC: 12.2 10*3/uL — ABNORMAL HIGH (ref 4.0–10.5)
nRBC: 0 % (ref 0.0–0.2)

## 2023-01-25 LAB — RESP PANEL BY RT-PCR (RSV, FLU A&B, COVID)  RVPGX2
Influenza A by PCR: NEGATIVE
Influenza B by PCR: NEGATIVE
Resp Syncytial Virus by PCR: NEGATIVE
SARS Coronavirus 2 by RT PCR: NEGATIVE

## 2023-01-25 LAB — COMPREHENSIVE METABOLIC PANEL
ALT: 7 U/L (ref 0–44)
AST: 10 U/L — ABNORMAL LOW (ref 15–41)
Albumin: 3.8 g/dL (ref 3.5–5.0)
Alkaline Phosphatase: 67 U/L (ref 38–126)
Anion gap: 10 (ref 5–15)
BUN: 19 mg/dL (ref 8–23)
CO2: 26 mmol/L (ref 22–32)
Calcium: 9.3 mg/dL (ref 8.9–10.3)
Chloride: 100 mmol/L (ref 98–111)
Creatinine, Ser: 0.73 mg/dL (ref 0.44–1.00)
GFR, Estimated: >60 mL/min (ref >60)
Glucose, Bld: 121 mg/dL — ABNORMAL HIGH (ref 70–99)
Potassium: 3.7 mmol/L (ref 3.5–5.1)
Sodium: 136 mmol/L (ref 135–145)
Total Bilirubin: 0.2 mg/dL — ABNORMAL LOW (ref 0.3–1.2)
Total Protein: 7.1 g/dL (ref 6.5–8.1)

## 2023-01-25 LAB — URINALYSIS, ROUTINE W REFLEX MICROSCOPIC
Bacteria, UA: NONE SEEN
Bilirubin Urine: NEGATIVE
Glucose, UA: NEGATIVE mg/dL
Nitrite: NEGATIVE
Protein, ur: 30 mg/dL — AB
Specific Gravity, Urine: 1.035 — ABNORMAL HIGH (ref 1.005–1.030)
WBC, UA: >50 WBC/hpf (ref 0–5)
pH: 6 (ref 5.0–8.0)

## 2023-01-25 LAB — GROUP A STREP BY PCR: Group A Strep by PCR: NOT DETECTED

## 2023-01-25 LAB — SEDIMENTATION RATE
Sed Rate: 86 mm/hr — ABNORMAL HIGH (ref 0–30)
Sed Rate: 78 mm/hr — ABNORMAL HIGH (ref 0–22)

## 2023-01-25 LAB — C-REACTIVE PROTEIN
CRP: 9.2 mg/dL (ref 0.5–20.0)
CRP: 8.6 mg/dL — ABNORMAL HIGH (ref <1.0)

## 2023-01-25 LAB — MONONUCLEOSIS SCREEN: Mono Screen: NEGATIVE

## 2023-01-25 MED ORDER — LIDOCAINE VISCOUS HCL 2 % MT SOLN: 15.0000 mL | OROMUCOSAL | 0 refills | Status: DC | PRN

## 2023-01-25 MED ORDER — DEXAMETHASONE 4 MG PO TABS
10.0000 mg | ORAL_TABLET | Freq: Once | ORAL | Status: AC
  Administered 2023-01-25: 10 mg via ORAL
  Filled 2023-01-25: qty 3

## 2023-01-25 MED ORDER — NYSTATIN 100000 UNIT/ML MT SUSP: 500000.0000 [IU] | Freq: Four times a day (QID) | OROMUCOSAL | 0 refills | Status: DC

## 2023-01-25 MED ORDER — AMOXICILLIN-POT CLAVULANATE 875-125 MG PO TABS
1.0000 | ORAL_TABLET | Freq: Once | ORAL | Status: AC
  Administered 2023-01-25: 1 via ORAL
  Filled 2023-01-25: qty 1

## 2023-01-25 MED ORDER — AMOXICILLIN-POT CLAVULANATE 875-125 MG PO TABS: 1.0000 | ORAL_TABLET | Freq: Two times a day (BID) | ORAL | 0 refills | Status: DC

## 2023-01-25 MED ORDER — LIDOCAINE VISCOUS HCL 2 % MT SOLN
15.0000 mL | Freq: Once | OROMUCOSAL | Status: AC
  Administered 2023-01-25: 15 mL via OROMUCOSAL
  Filled 2023-01-25: qty 15

## 2023-01-25 MED ORDER — AZITHROMYCIN 250 MG PO TABS: 250.0000 mg | ORAL_TABLET | Freq: Every day | ORAL | 0 refills | Status: DC

## 2023-01-25 NOTE — Discharge Instructions (Addendum)
Please follow-up with your primary care doctor, in 1 week to get repeat blood work.  I believe that your excess platelets is secondary to your possible infection.  We found evidence of pneumonia, and a Paige Monarrez pleural effusion on your x-ray, please follow-up with your primary care doctor for further evaluation.  We are starting you on some antibiotics for this.  Additionally I recommend that you follow-up with the GI, given the white plaques on the back of your throat.  I prescribed some nystatin for this, and you may need an endoscopy.

## 2023-01-25 NOTE — ED Triage Notes (Addendum)
Sore throat and joint pain for the past week. History of strep throat a few weeks ago. Pt was seen at doctors and found to have elevated platelet count and sent here for further evaluation.

## 2023-01-25 NOTE — Progress Notes (Signed)
Katherine Sexton is a 67 y.o. female here for follow-up of ongoing symptoms.  History of Present Illness:   Chief Complaint  Patient presents with   Joint Pain    Pt c/o increase in joint pain and swelling. Naproxen is not helping.    HPI  See full HPI on 01/22/23 and 01/23/23.  Husband is with her.  Patient reports that she is continuing to worsen She has had worsening sore throat and joint pain  She notes that her other family members that went to the beach with her are also sick They have looked up information online and found that there are bacterial illnesses in the water where they stayed  Past Medical History:  Diagnosis Date   Anxiety    Chicken pox    childhood    Hip bursitis    hip bursitis   Migraines    Dr. Neale Burly of headache clinic. Started with Dr. Meryl Crutch years ago. Sumatriptan about 2x a week when stressed.    PSVT (paroxysmal supraventricular tachycardia)    1993. provoked by atypical diet. no issues since then. dehydrated, wasnt caring for self   Urinary tract infection      Social History   Tobacco Use   Smoking status: Never   Smokeless tobacco: Never  Vaping Use   Vaping status: Never Used  Substance Use Topics   Alcohol use: Yes    Alcohol/week: 3.0 - 4.0 standard drinks of alcohol    Types: 3 - 4 Glasses of wine per week    Comment: Occassionally ;    Drug use: No    Past Surgical History:  Procedure Laterality Date   ABDOMINAL HYSTERECTOMY     for prolapse   BREAST ENHANCEMENT SURGERY Bilateral 01/2012   TOTAL HIP ARTHROPLASTY Right 02/26/2018   Procedure: RIGHT TOTAL HIP ARTHROPLASTY ANTERIOR APPROACH;  Surgeon: Ollen Gross, MD;  Location: [facility];  Service: Orthopedics;  Laterality: Right;   TUBAL LIGATION      Family History  Problem Relation Age of Onset   Hypertension Mother    Osteoporosis Mother    Colon polyps Mother        mid 27s. not sure what type   COPD Mother        long term smoker   Stroke Mother        41. TIA a  few years prior   Congestive Heart Failure Mother        57   Hypertension Father    Heart disease Father        CABG- mid 44s   Renal cancer Father        early 73s   Pulmonary fibrosis Father        lead to his death   Colon cancer Sister    Breast cancer Daughter    Healthy Son    Lung cancer Maternal Grandmother    Heart disease Paternal Grandmother    Heart attack Paternal Grandfather        died in 78s   Diabetes Neg Hx    Esophageal cancer Neg Hx    Gallbladder disease Neg Hx     No Known Allergies  Current Medications:   Current Outpatient Medications:    acetaminophen (TYLENOL) 325 MG tablet, Take 650 mg by mouth every 6 (six) hours as needed., Disp: , Rfl:    amphetamine-dextroamphetamine (ADDERALL) 20 MG tablet, Take 20 mg by mouth 2 (two) times daily. , Disp: , Rfl: 0   naproxen  sodium (ANAPROX DS) 550 MG tablet, Take 1 tablet (550 mg total) by mouth 2 (two) times daily with a meal., Disp: 30 tablet, Rfl: 0   SUMAtriptan (IMITREX) 100 MG tablet, Take 100 mg by mouth every 2 (two) hours as needed for migraine. May repeat in 2 hours if headache persists or recurs., Disp: , Rfl:    Review of Systems:   ROS Negative unless otherwise specified per HPI.  Vitals:   Vitals:   01/25/23 1058  BP: (!) 160/100  Pulse: (!) 102  Temp: 97.7 F (36.5 C)  TempSrc: Temporal  Weight: 173 lb (78.5 kg)  Height: 5\' 8"  (1.727 m)     Body mass index is 26.3 kg/m.  Physical Exam:   Physical Exam Constitutional:      Appearance: Normal appearance. She is well-developed.  HENT:     Head: Normocephalic and atraumatic.  Eyes:     General: Lids are normal.     Extraocular Movements: Extraocular movements intact.     Conjunctiva/sclera: Conjunctivae normal.  Pulmonary:     Effort: Pulmonary effort is normal.  Musculoskeletal:        General: Normal range of motion.     Cervical back: Normal range of motion and neck supple.  Skin:    General: Skin is warm and dry.   Neurological:     Mental Status: She is alert and oriented to person, place, and time.  Psychiatric:        Attention and Perception: Attention and perception normal.        Mood and Affect: Mood normal.        Behavior: Behavior normal.        Thought Content: Thought content normal.        Judgment: Judgment normal.     Assessment and Plan:   Thrombocytosis Symptoms worsening  Discussion today with husband and patient -- recommend ER evaluation as soon as possible due to acute worsening of symptom(s).  Family agreeable to plan.  [name], PA-C

## 2023-01-25 NOTE — ED Provider Notes (Signed)
[place] [facility]PARTMENT AT [facility] Provider Note   CSN: [id number] Arrival date & time: 01/25/23  1159     History  Chief Complaint  Patient presents with   Sore Throat    Katherine Sexton is a 67 y.o. female, pertinent past medical history, who presents to the ED secondary to sore throat, joint pain and swelling for the last week.  She states that she had strep in June, was treated with 10 days antibiotics, but had lingering cough afterwards.  Since then has had just a lingering cough, then developed severe sore throat last week, and joint swelling.  Went to see her primary care doctor and was told that her platelets were elevated, and had negative COVID, strep test, [facility] spotted fever, and Lyme test.  She notes that her joints hurt so much particularly in her shoulders, and arms, and fingers that it is hard to brush her hair.  Denies any weakness however.  Pain is now going to her lower limbs as well.  Denies any weakness.  No fevers, chills, blood in stool, shortness of breath, chest pain.    Home Medications Prior to Admission medications   Medication Sig Start Date End Date Taking? Authorizing Provider  amoxicillin-clavulanate (AUGMENTIN) 875-125 MG tablet Take 1 tablet by mouth every 12 (twelve) hours. 01/25/23  Yes Tocarra Gassen L, PA  azithromycin (ZITHROMAX) 250 MG tablet Take 1 tablet (250 mg total) by mouth daily. Take first 2 tablets together, then 1 every day until finished. 01/25/23  Yes Rishard Delange L, PA  lidocaine (XYLOCAINE) 2 % solution Use as directed 15 mLs in the mouth or throat as needed for mouth pain. 01/25/23  Yes Zoye Chandra L, PA  nystatin (MYCOSTATIN) 100000 UNIT/ML suspension Take 5 mLs (500,000 Units total) by mouth 4 (four) times daily. 01/25/23  Yes Dorwin Fitzhenry L, PA  acetaminophen (TYLENOL) 325 MG tablet Take 650 mg by mouth every 6 (six) hours as needed.    [provider]  amphetamine-dextroamphetamine (ADDERALL) 20 MG  tablet Take 20 mg by mouth 2 (two) times daily.  10/09/17   [provider]  naproxen sodium (ANAPROX DS) 550 MG tablet Take 1 tablet (550 mg total) by mouth 2 (two) times daily with a meal. 01/22/23   [name], PA  SUMAtriptan (IMITREX) 100 MG tablet Take 100 mg by mouth every 2 (two) hours as needed for migraine. May repeat in 2 hours if headache persists or recurs.    [provider]      Allergies    Patient has no known allergies.    Review of Systems   Review of Systems  Constitutional:  Negative for fever.  Musculoskeletal:  Positive for joint swelling.    Physical Exam Updated Vital Signs BP (!) 148/75   Pulse 91   Temp 98 F (36.7 C) (Oral)   Resp 18   SpO2 99%  Physical Exam Vitals and nursing note reviewed.  Constitutional:      General: She is not in acute distress.    Appearance: She is well-developed.  HENT:     Head: Normocephalic and atraumatic.     Mouth/Throat:     Pharynx: Posterior oropharyngeal erythema present. No oropharyngeal exudate.     Comments: +white exudates in posterior oropharynx but no tonsils. Not able to scrape off.  Eyes:     Conjunctiva/sclera: Conjunctivae normal.  Cardiovascular:     Rate and Rhythm: Normal rate and regular rhythm.  Heart sounds: No murmur heard. Pulmonary:     Effort: Pulmonary effort is normal. No respiratory distress.     Breath sounds: Normal breath sounds.  Abdominal:     Palpations: Abdomen is soft.     Tenderness: There is no abdominal tenderness.  Musculoskeletal:        General: No swelling.     Cervical back: Neck supple.     Comments: 5 strength of bilateral upper and bilateral lower extremities.  Tenderness to palpation of bilateral shoulders, and fingers.  Mild joint swelling, the fingers, toes.  Skin:    General: Skin is warm and dry.     Capillary Refill: Capillary refill takes less than 2 seconds.  Neurological:     Mental Status: She is alert.  Psychiatric:         Mood and Affect: Mood normal.     ED Results / Procedures / Treatments   Labs (all labs ordered are listed, but only abnormal results are displayed) Labs Reviewed  CBC WITH DIFFERENTIAL/PLATELET - Abnormal; Notable for the following components:      Result Value   WBC 12.2 (*)    RBC 3.70 (*)    Hemoglobin 11.2 (*)    HCT 34.4 (*)    Platelets 681 (*)    Neutro Abs 9.3 (*)    Eosinophils Absolute 0.8 (*)    All other components within normal limits  COMPREHENSIVE METABOLIC PANEL - Abnormal; Notable for the following components:   Glucose, Bld 121 (*)    AST 10 (*)    Total Bilirubin 0.2 (*)    All other components within normal limits  URINALYSIS, ROUTINE W REFLEX MICROSCOPIC - Abnormal; Notable for the following components:   APPearance HAZY (*)    Specific Gravity, Urine 1.035 (*)    Hgb urine dipstick Erricka Falkner (*)    Ketones, ur TRACE (*)    Protein, ur 30 (*)    Leukocytes,Ua LARGE (*)    Non Squamous Epithelial 0-5 (*)    All other components within normal limits  RESP PANEL BY RT-PCR (RSV, FLU A&B, COVID)  [id number]  GROUP A STREP BY PCR  MONONUCLEOSIS SCREEN  C-REACTIVE PROTEIN  SEDIMENTATION RATE    EKG None  Radiology DG Chest 2 View  Result Date: 01/25/2023 CLINICAL DATA:  joint pain, infecitous symptoms EXAM: CHEST - 2 VIEW COMPARISON:  CXR 10/14/13 FINDINGS: The heart size and mediastinal contours are within normal limits. Yuma Blucher right pleural effusion. No pneumothorax. Hazy opacity at the left lung base could represent atelectasis or infection. The visualized skeletal structures are unremarkable. IMPRESSION: Gilles Trimpe right pleural effusion. There is hazy opacity at the left lung base could represent atelectasis or infection. Electronically Signed   By: Lorenza Cambridge M.D.   On: 01/25/2023 13:23    Procedures Procedures    Medications Ordered in ED Medications  lidocaine (XYLOCAINE) 2 % viscous mouth solution 15 mL (15 mLs Mouth/Throat Given 01/25/23 1251)   dexamethasone (DECADRON) tablet 10 mg (10 mg Oral Given 01/25/23 1511)  amoxicillin-clavulanate (AUGMENTIN) 875-125 MG per tablet 1 tablet (1 tablet Oral Given 01/25/23 1511)    ED Course/ Medical Decision Making/ A&P                             Medical Decision Making Patient is a 67 year old female, here for sore throat, joint swelling has been going for the last week.  She also reports that she has  had a chronic cough, and had testing for Lyme disease, [place] spotted fever, that has been negative.  On exam she has white exudates in her throat, and clear lung sounds.  We will obtain a chest x-ray, blood work, including mono, strep, COVID/flu, urinalysis, CRP, sed rate for further evaluation.  I discussed and staffed this with Dr. Wallace Cullens, who has agreed to look at the patient I appreciate his recommendations.  Amount and/or Complexity of Data Reviewed Labs: ordered.    Details: Labs show mild leukocytosis of 12.2, elevated platelets. Radiology: ordered.    Details: Chest x-ray shows left lower lobe pneumonia, and a right sided pleural effusion ECG/medicine tests:     Details: Normal PR interval Discussion of management or test interpretation with external provider(s): Patient is a 67 year old female, here for sore throat, body aches, and swelling of her joints, has been going for the past week.  Has been tested negative for take diseases.  Has elevated platelets outpatient, which have downtrended here.  They still elevated and I recommend that she follow-up with her primary care for repeat CBC.  Additionally she has a left lower lobe pneumonia, which may be causing the persistent cough.  She also has exudates in the back of her throat, and but not on the tonsils, this may be concerning for eosinophilic esophagitis, versus yeast.  Will start her on nystatin outpatient, and have her follow-up with GI.  She also states that she has very bad GERD, she may benefit from an endoscopy see a GI  referral.  She does not have any chest pain or shortness of breath, and she meets very few criteria for rheumatic fever.  I had m physician Dr. Wallace Cullens evaluate her, and he recommends sending her home with Augmentin, Z-Pak, to treat the pneumonia, and nystatin for the exudates in the back of her throat.  GI referral sent.  Return precautions emphasized.  Informed that she has right-sided pleural effusion, etiology unknown, will need to follow-up with primary for this.  Risk Prescription drug management.    Final Clinical Impression(s) / ED Diagnoses Final diagnoses:  Pneumonia of left lower lobe due to infectious organism  Esophagitis    Rx / DC Orders ED Discharge Orders          Ordered    nystatin (MYCOSTATIN) 100000 UNIT/ML suspension  4 times daily        01/25/23 1507    amoxicillin-clavulanate (AUGMENTIN) 875-125 MG tablet  Every 12 hours        01/25/23 1507    azithromycin (ZITHROMAX) 250 MG tablet  Daily        01/25/23 1507    lidocaine (XYLOCAINE) 2 % solution  As needed        01/25/23 1507              Velmer Woelfel L, PA 01/25/23 1521    Tanda Rockers A, DO 01/29/23 0421

## 2023-01-26 LAB — ROCKY MTN SPOTTED FVR ABS PNL(IGG+IGM)
RMSF IgG: NOT DETECTED
RMSF IgM: NOT DETECTED

## 2023-01-26 LAB — B. BURGDORFI ANTIBODIES: B burgdorferi Ab IgG+IgM: <0.9 index

## 2023-01-27 LAB — ANA: Anti Nuclear Antibody (ANA): POSITIVE — AB

## 2023-01-27 LAB — ANTI-NUCLEAR AB-TITER (ANA TITER): ANA Titer 1: 1:320 {titer} — ABNORMAL HIGH

## 2023-01-30 ENCOUNTER — Encounter: Payer: Self-pay | Admitting: *Deleted

## 2023-01-30 ENCOUNTER — Other Ambulatory Visit: Payer: Self-pay

## 2023-01-30 ENCOUNTER — Encounter: Payer: Self-pay | Admitting: Family Medicine

## 2023-01-30 DIAGNOSIS — R768 Other specified abnormal immunological findings in serum: Secondary | ICD-10-CM

## 2023-01-30 DIAGNOSIS — M2550 Pain in unspecified joint: Secondary | ICD-10-CM

## 2023-01-31 ENCOUNTER — Telehealth: Payer: Self-pay

## 2023-01-31 NOTE — Telephone Encounter (Signed)
Transition Care Management Follow-up Telephone Call Date of discharge and from where: [facility] 7/12 How have you been since you were released from the hospital? Doing much better  Any questions or concerns? No  Items Reviewed: Did the pt receive and understand the discharge instructions provided? Yes  Medications obtained and verified? Yes  Other? No  Any new allergies since your discharge? No  Dietary orders reviewed? No Do you have support at home? Yes     Follow up appointments reviewed:  PCP Hospital f/u appt confirmed? Yes  Scheduled to see  on  @ . Specialist Hospital f/u appt confirmed? No  Scheduled to see  on  @ . Are transportation arrangements needed? No  If their condition worsens, is the pt aware to call PCP or go to the Emergency Dept.? Yes Was the patient provided with contact information for the PCP's office or ED? Yes Was to pt encouraged to call back with questions or concerns? Yes

## 2023-02-03 ENCOUNTER — Encounter: Payer: Self-pay | Admitting: Family Medicine

## 2023-02-04 ENCOUNTER — Other Ambulatory Visit: Payer: Self-pay | Admitting: Physician Assistant

## 2023-02-04 ENCOUNTER — Other Ambulatory Visit: Payer: Self-pay

## 2023-02-04 MED ORDER — NAPROXEN SODIUM 550 MG PO TABS: 550.0000 mg | ORAL_TABLET | Freq: Two times a day (BID) | ORAL | 2 refills | Status: AC

## 2023-02-04 NOTE — Telephone Encounter (Signed)
Patient called requesting refill of naproxen sodium. States she is waiting for rheumatology to get her in soon but in the meantime is having pain. Patient requests either Samantha or Dr. Durene Cal refill this for her in the meantime.

## 2023-02-04 NOTE — Telephone Encounter (Signed)
Refill sent.

## 2023-02-05 NOTE — Telephone Encounter (Signed)
I have talked with [facility]. They have received all necessary paperwork. Dr is currently looking over it.

## 2023-02-06 DIAGNOSIS — M79641 Pain in right hand: Secondary | ICD-10-CM | POA: Diagnosis not present

## 2023-02-06 DIAGNOSIS — R768 Other specified abnormal immunological findings in serum: Secondary | ICD-10-CM | POA: Diagnosis not present

## 2023-02-06 DIAGNOSIS — Z6826 Body mass index (BMI) 26.0-26.9, adult: Secondary | ICD-10-CM | POA: Diagnosis not present

## 2023-02-06 DIAGNOSIS — M2540 Effusion, unspecified joint: Secondary | ICD-10-CM | POA: Diagnosis not present

## 2023-02-06 DIAGNOSIS — D8989 Other specified disorders involving the immune mechanism, not elsewhere classified: Secondary | ICD-10-CM | POA: Diagnosis not present

## 2023-02-06 DIAGNOSIS — E663 Overweight: Secondary | ICD-10-CM | POA: Diagnosis not present

## 2023-02-06 DIAGNOSIS — R5383 Other fatigue: Secondary | ICD-10-CM | POA: Diagnosis not present

## 2023-02-06 DIAGNOSIS — M79642 Pain in left hand: Secondary | ICD-10-CM | POA: Diagnosis not present

## 2023-02-06 DIAGNOSIS — M2560 Stiffness of unspecified joint, not elsewhere classified: Secondary | ICD-10-CM | POA: Diagnosis not present

## 2023-02-13 ENCOUNTER — Encounter (INDEPENDENT_AMBULATORY_CARE_PROVIDER_SITE_OTHER): Payer: Self-pay

## 2023-02-14 ENCOUNTER — Ambulatory Visit (INDEPENDENT_AMBULATORY_CARE_PROVIDER_SITE_OTHER): Payer: Medicare HMO | Admitting: Family Medicine

## 2023-02-14 ENCOUNTER — Encounter: Payer: Self-pay | Admitting: Family Medicine

## 2023-02-14 VITALS — BP 112/80 | HR 98 | Temp 98.0°F | Ht 68.0 in | Wt 172.0 lb

## 2023-02-14 DIAGNOSIS — E559 Vitamin D deficiency, unspecified: Secondary | ICD-10-CM

## 2023-02-14 DIAGNOSIS — J189 Pneumonia, unspecified organism: Secondary | ICD-10-CM | POA: Diagnosis not present

## 2023-02-14 DIAGNOSIS — E538 Deficiency of other specified B group vitamins: Secondary | ICD-10-CM

## 2023-02-14 NOTE — Patient Instructions (Addendum)
Reschedule your mammogram  Thrilled you are getting colonoscopy!   Glad you are feeling better! And have rheumatology follow up   In 2 weeks follow up CXR  Please go to [facility]  [facility] (updated 09/10/2019) - located [address] across the [address] from [place] - in the basement - Hours: 8:30-5:00 PM M-F (with lunch from 12:30- 1 PM). You do NOT need an appointment.    Recommended follow up: Return for next already scheduled visit or sooner if needed.

## 2023-02-14 NOTE — Progress Notes (Signed)
Phone [phone] In person visit   Subjective:   Katherine Sexton is a 67 y.o. year old very pleasant female patient who presents for/with See problem oriented charting Chief Complaint  Patient presents with   Cough    Pt c/o continued cough after pneumonia, wants to make sure its cleared. (Will discuss HM at f/u or CPE visit)   Past Medical History-  Patient Active Problem List   Diagnosis Date Noted   B12 deficiency 02/14/2023    Priority: Medium    Vitamin D deficiency 02/14/2023    Priority: Medium    Hyperlipidemia 01/06/2018    Priority: Medium    ADD (attention deficit disorder) 10/22/2017    Priority: Medium    Migraines     Priority: Medium    History of PSVT (paroxysmal supraventricular tachycardia) 03/23/2008    Priority: Low   OA (osteoarthritis) of hip 02/26/2018    Priority: 1.    Medications- reviewed and updated Current Outpatient Medications  Medication Sig Dispense Refill   acetaminophen (TYLENOL) 325 MG tablet Take 650 mg by mouth every 6 (six) hours as needed.     amphetamine-dextroamphetamine (ADDERALL) 20 MG tablet Take 20 mg by mouth 2 (two) times daily.   0   lidocaine (XYLOCAINE) 2 % solution Use as directed 15 mLs in the mouth or throat as needed for mouth pain. 100 mL 0   naproxen sodium (ANAPROX DS) 550 MG tablet Take 1 tablet (550 mg total) by mouth 2 (two) times daily with a meal. 30 tablet 2   nystatin (MYCOSTATIN) 100000 UNIT/ML suspension Take 5 mLs (500,000 Units total) by mouth 4 (four) times daily. 60 mL 0   SUMAtriptan (IMITREX) 100 MG tablet Take 100 mg by mouth every 2 (two) hours as needed for migraine. May repeat in 2 hours if headache persists or recurs.     No current facility-administered medications for this visit.     Objective:  BP 112/80   Pulse 98   Temp 98 F (36.7 C)   Ht 5\' 8"  (1.727 m)   Wt 172 lb (78 kg)   SpO2 95%   BMI 26.15 kg/m  Gen: NAD, resting comfortably CV: RRR no murmurs rubs or gallops Lungs: CTAB no  crackles, wheeze, rhonchi  Ext: no edema Skin: warm, dry     Assessment and Plan   # Pneumonia follow-up S: Patient was seen in the emergency department on 01/25/2023 with sore throat, joint pain, edema for approximately a week.  Had strep in June and was treated with 10 days of antibiotics but had lingering cough afterwards (she reports was also treated for possible bronchitis).  The thing that prompted the emergency department visit was new sore throat as well as joint swelling/severe pain (hard to get out of bed) and on labs had elevated platelets despite negative COVID, strep, [place] spotted fever and Lyme testing.  Pain was in multiple joints including shoulders, arms, fingers and difficult to even brush her hair as result. -On exam had white exudates in her throat and clear lung sounds.  Due to lingering cough x-ray was obtained as well as blood work, Monospot, strep, COVID/flu, urinalysis, CRP, sed rate.  Sedimentation rate remained elevated at 78 emergency department and had been 86 and 88 1 and 3 weeks prior.  In emergency department CRP was 8.6 which was roughly stable with other outpatient readings.  Platelets were trending down emergency department from peak of 767 - She was discovered to have a left  lower lobe pneumonia and with exudates on mucosa concerning for yeast versus eosinophilic esophagitis-she was started on nystatin and referred to GI. - She was treated with a combination of Augmentin and azithromycin to treat the pneumonia as well as ongoing nystatin.  She also had a right-sided pleural effusion of unknown cause  Patient reached out to [place] about 5 days later with continued joint pain and swelling.  Patient had a positive ANA along with the symptoms so we referred her to rheumatology  She saw [facility] rheumatology on 02/06/2023 with Dr. Melburn Hake placed her on a prednisone trial after obtaining vitamin D, B12, folate, ESR, CRP.  She was concerned about rheumatoid  arthritis versus post viral syndrome causing this. Prednisone was very helpful and has follow up very soon. Reports B12 and D were low. Still some concern that thsi could be rheumatoid arthritis.   There was an irregular heart rate on the exam and a mention following up with PCP-I did not note this on exam today.  Also EKG reassuring 01/25/2023 is further arrhythmias.  No palpitations or chest pain   Was having lingering cough last week but finally resolved this week-prednisone may have helped.  A/P: Patient with community-acquired pneumonia that appears to have resolved - Recommended still completing follow-up chest x-ray in approximately 2 weeks at [place] [facility].  This will give [place] a chance to monitor the small right pleural effusion and make sure no changes well or perhaps has even improved - I am thankful for rheumatology's involvement with positive ANA and elevated sedimentation rate-prednisone seem to have been very helpful on multiple fronts and she has close follow-up tomorrow-joint pain and stiffness has much improved.  Symptoms are worse in the morning and wonder about something like rheumatoid arthritis or polymyalgia rheumatica -Prior EKG reassuring, exam today without regular heart rate and she is asymptomatic so do not feel strongly about further workup from a cardiac perspective at this time  # B12 deficiency S: Reports recently diagnosed A/P: We have added B12 deficiency to her problem list-she is going to discuss therapy options tomorrow with rheumatology-I am more than happy to help but do not have access to the numbers at this time-we will follow-up with next rheumatology note  #Vitamin D deficiency S: Reports recently diagnosed A/P: We have added vitamin D deficiency to her problem list-she is going to discuss therapy options tomorrow with rheumatology-I am more than happy to help but do not have access to the numbers at this time-we will follow-up with next rheumatology note  just like for B12   Recommended follow up: Return for next already scheduled visit or sooner if needed. Future Appointments  Date Time Provider Department Center  03/11/2023  2:20 PM Shelva Majestic, MD [facility]    Lab/Order associations:   ICD-10-CM   1. Community acquired pneumonia of left lower lobe of lung  J18.9 DG Chest 2 View    2. B12 deficiency  E53.8     3. Vitamin D deficiency  E55.9       Time Spent: 30 minutes of total time (1:30 PM-2 PM) was spent on the date of the encounter performing the following actions: chart review prior to seeing the patient, obtaining history, performing a medically necessary exam, counseling on the treatment plan and follow-up steps including x-rays, placing orders, and documenting in our EHR.    Return precautions advised.  Tana Conch, MD

## 2023-02-15 DIAGNOSIS — M2540 Effusion, unspecified joint: Secondary | ICD-10-CM | POA: Diagnosis not present

## 2023-02-15 DIAGNOSIS — M0579 Rheumatoid arthritis with rheumatoid factor of multiple sites without organ or systems involvement: Secondary | ICD-10-CM | POA: Diagnosis not present

## 2023-02-15 DIAGNOSIS — M2560 Stiffness of unspecified joint, not elsewhere classified: Secondary | ICD-10-CM | POA: Diagnosis not present

## 2023-02-15 DIAGNOSIS — M79642 Pain in left hand: Secondary | ICD-10-CM | POA: Diagnosis not present

## 2023-02-15 DIAGNOSIS — E663 Overweight: Secondary | ICD-10-CM | POA: Diagnosis not present

## 2023-02-15 DIAGNOSIS — R5383 Other fatigue: Secondary | ICD-10-CM | POA: Diagnosis not present

## 2023-02-15 DIAGNOSIS — E559 Vitamin D deficiency, unspecified: Secondary | ICD-10-CM | POA: Diagnosis not present

## 2023-02-15 DIAGNOSIS — E538 Deficiency of other specified B group vitamins: Secondary | ICD-10-CM | POA: Diagnosis not present

## 2023-02-15 DIAGNOSIS — M79641 Pain in right hand: Secondary | ICD-10-CM | POA: Diagnosis not present

## 2023-02-15 DIAGNOSIS — Z6826 Body mass index (BMI) 26.0-26.9, adult: Secondary | ICD-10-CM | POA: Diagnosis not present

## 2023-03-07 ENCOUNTER — Ambulatory Visit
Admission: RE | Admit: 2023-03-07 | Discharge: 2023-03-07 | Disposition: A | Discharge status: Home / Self Care | Payer: Medicare HMO | Source: Ambulatory Visit | Attending: Family Medicine | Admitting: Family Medicine

## 2023-03-07 DIAGNOSIS — J189 Pneumonia, unspecified organism: Secondary | ICD-10-CM

## 2023-03-11 ENCOUNTER — Ambulatory Visit (INDEPENDENT_AMBULATORY_CARE_PROVIDER_SITE_OTHER): Payer: Medicare HMO | Admitting: Family Medicine

## 2023-03-11 ENCOUNTER — Encounter: Payer: Self-pay | Admitting: Family Medicine

## 2023-03-11 VITALS — BP 102/74 | HR 90 | Temp 97.5°F | Ht 68.0 in | Wt 170.4 lb

## 2023-03-11 DIAGNOSIS — E785 Hyperlipidemia, unspecified: Secondary | ICD-10-CM | POA: Diagnosis not present

## 2023-03-11 DIAGNOSIS — Z780 Asymptomatic menopausal state: Secondary | ICD-10-CM

## 2023-03-11 DIAGNOSIS — Z1211 Encounter for screening for malignant neoplasm of colon: Secondary | ICD-10-CM

## 2023-03-11 DIAGNOSIS — E559 Vitamin D deficiency, unspecified: Secondary | ICD-10-CM | POA: Diagnosis not present

## 2023-03-11 DIAGNOSIS — G43009 Migraine without aura, not intractable, without status migrainosus: Secondary | ICD-10-CM | POA: Diagnosis not present

## 2023-03-11 DIAGNOSIS — J189 Pneumonia, unspecified organism: Secondary | ICD-10-CM

## 2023-03-11 DIAGNOSIS — E538 Deficiency of other specified B group vitamins: Secondary | ICD-10-CM

## 2023-03-11 NOTE — Patient Instructions (Addendum)
Let us know when you get your flu shot or COVID vaccine this fall  Schedule a pap smear with GYN as well Get your mammogram done when the shoulders are better  [facility] contact Please call to schedule visit and/or procedure Address: [address] [place], [place], [place] [address] Phone: [phone]   Schedule your bone density test at check out desk.  - located [address] across the street from [place] - in the basement - you DO NEED an appointment for the bone density tests.   Recommended follow up: Return in about 6 months (around 09/11/2023) for physical or sooner if needed.Schedule b4 you leave.

## 2023-03-11 NOTE — Progress Notes (Signed)
Phone [phone] In person visit   Subjective:   Katherine Sexton is a 67 y.o. year old very pleasant female patient who presents for/with See problem oriented charting Chief Complaint  Patient presents with   1 month f/u    Pt is here for 1 month f/u on pneumonia, states she is doing better.    Past Medical History-  Patient Active Problem List   Diagnosis Date Noted   B12 deficiency 02/14/2023    Priority: Medium    Vitamin D deficiency 02/14/2023    Priority: Medium    Hyperlipidemia 01/06/2018    Priority: Medium    ADD (attention deficit disorder) 10/22/2017    Priority: Medium    Migraines     Priority: Medium    History of PSVT (paroxysmal supraventricular tachycardia) 03/23/2008    Priority: Low   OA (osteoarthritis) of hip 02/26/2018    Priority: 1.    Medications- reviewed and updated Current Outpatient Medications  Medication Sig Dispense Refill   amphetamine-dextroamphetamine (ADDERALL) 20 MG tablet Take 20 mg by mouth 2 (two) times daily.   0   hydroxychloroquine (PLAQUENIL) 200 MG tablet Take 200-400 mg by mouth daily. Through rheumatology- Take 1 tab on odd numbered days and 2 tabs on even numbered days Orally daily for 90 days     naproxen sodium (ANAPROX DS) 550 MG tablet Take 1 tablet (550 mg total) by mouth 2 (two) times daily with a meal. 30 tablet 2   SUMAtriptan (IMITREX) 100 MG tablet Take 100 mg by mouth every 2 (two) hours as needed for migraine. May repeat in 2 hours if headache persists or recurs.     No current facility-administered medications for this visit.     Objective:  BP 102/74   Pulse 90   Temp (!) 97.5 F (36.4 C)   Ht 5\' 8"  (1.727 m)   Wt 170 lb 6.4 oz (77.3 kg)   SpO2 95%   BMI 25.91 kg/m  Gen: NAD, resting comfortably CV: RRR no murmurs rubs or gallops Lungs: CTAB no crackles, wheeze, rhonchi Ext: no edema Skin: warm, dry Musculoskeletal: some pain with lifting hands over head- but states improving    Assessment and  Plan   #social update- grandsons at [employer] starting now- enjoying it  #CAP - seen 02/14/23 for pneumonia follow up- had made substantial clinical improvement and plan was for repeat CXR in 2 more weeks at [facility] to follow up on pneumonia and small right pleural effusion -CXR was taken 03/07/23 but is not yet read  -no breathing issues or cough etc.  - to my eye I believe the effusion has resolved and pneumonia improved- will await formal overview. Lungs clear today  # Rheumatoid arthritis S: Arthralgia/positive ANA and elevated ESR- was seeing rheumatology at last visit -she is improving. Was told could take 6 weeks to see full benefit and then reassess -on hydroxychloroquine and last dose of prednisone tomorrow 0.5 tablet of 5 mg -still worse in morning and weans off pain wise as day goes on -still on naproxen A/P: rheumatoid arthritis significantly improved- keep close follow up with rheumatology and continue current medications  including hydroxychloroquine and last dose of prednisone tomorrow  # B12 deficiency S: Current treatment/medication (oral vs. IM): weekly shots through last Friday and now on monthly and also on b12 -reportedly low through rheum - was on 164 A/P: strongly suspect improving- update B12 at physical in 6 months- continue current medications for now    #  Vitamin D deficiency S: Medication: taking 2000 units a day Last vitamin D- reports low with rheumatology at 21.3 A/P: suspect improving- check vitamin D at physical in february   #hyperlipidemia S: Medication: none Lab Results  Component Value Date   CHOL 302 (H) 11/06/2017   HDL 69.80 11/06/2017   LDLDIRECT 187.0 11/06/2017   TRIG 291.0 (H) 11/06/2017   CHOLHDL 4 11/06/2017   A/P: elevated and past due- discussed checking lipids but shed prefer to come back and have husbands support (for blood draw and has had many lately- which I understand)   #attention deficit disorder- managed by Dr. Evelene Croon on  Adderall 20 mg twice daily  #Migraines- through [facility] - on sumatriptan  #health maintenance- see avs discussions  Recommended follow up: Return in about 6 months (around 09/11/2023) for physical or sooner if needed.Schedule b4 you leave.  Lab/Order associations:   ICD-10-CM   1. Community acquired pneumonia of left lower lobe of lung  J18.9     2. B12 deficiency  E53.8     3. Vitamin D deficiency  E55.9     4. Hyperlipidemia, unspecified hyperlipidemia type  E78.5     5. Migraine without aura and without status migrainosus, not intractable  G43.009     6. Encounter for screening colonoscopy  Z12.11 Ambulatory referral to Gastroenterology    7. Postmenopausal  Z78.0 DG Bone Density      No orders of the defined types were placed in this encounter.   Return precautions advised.  Tana Conch, MD

## 2023-03-20 ENCOUNTER — Ambulatory Visit (INDEPENDENT_AMBULATORY_CARE_PROVIDER_SITE_OTHER)
Admission: RE | Admit: 2023-03-20 | Discharge: 2023-03-20 | Disposition: A | Discharge status: Home / Self Care | Payer: Medicare HMO | Source: Ambulatory Visit | Attending: Family Medicine | Admitting: Family Medicine

## 2023-03-20 DIAGNOSIS — Z780 Asymptomatic menopausal state: Secondary | ICD-10-CM

## 2023-04-19 ENCOUNTER — Other Ambulatory Visit: Payer: Self-pay | Admitting: Family Medicine

## 2023-04-19 DIAGNOSIS — Z1212 Encounter for screening for malignant neoplasm of rectum: Secondary | ICD-10-CM

## 2023-04-19 DIAGNOSIS — Z1211 Encounter for screening for malignant neoplasm of colon: Secondary | ICD-10-CM

## 2023-05-03 DIAGNOSIS — M79642 Pain in left hand: Secondary | ICD-10-CM | POA: Diagnosis not present

## 2023-05-03 DIAGNOSIS — M2560 Stiffness of unspecified joint, not elsewhere classified: Secondary | ICD-10-CM | POA: Diagnosis not present

## 2023-05-03 DIAGNOSIS — E538 Deficiency of other specified B group vitamins: Secondary | ICD-10-CM | POA: Diagnosis not present

## 2023-05-03 DIAGNOSIS — M79641 Pain in right hand: Secondary | ICD-10-CM | POA: Diagnosis not present

## 2023-05-03 DIAGNOSIS — E559 Vitamin D deficiency, unspecified: Secondary | ICD-10-CM | POA: Diagnosis not present

## 2023-05-03 DIAGNOSIS — R5383 Other fatigue: Secondary | ICD-10-CM | POA: Diagnosis not present

## 2023-05-03 DIAGNOSIS — M2540 Effusion, unspecified joint: Secondary | ICD-10-CM | POA: Diagnosis not present

## 2023-05-03 DIAGNOSIS — E663 Overweight: Secondary | ICD-10-CM | POA: Diagnosis not present

## 2023-05-03 DIAGNOSIS — Z6826 Body mass index (BMI) 26.0-26.9, adult: Secondary | ICD-10-CM | POA: Diagnosis not present

## 2023-05-03 DIAGNOSIS — M0579 Rheumatoid arthritis with rheumatoid factor of multiple sites without organ or systems involvement: Secondary | ICD-10-CM | POA: Diagnosis not present

## 2023-05-20 ENCOUNTER — Encounter: Payer: Self-pay | Admitting: Physician Assistant

## 2023-05-20 ENCOUNTER — Telehealth (INDEPENDENT_AMBULATORY_CARE_PROVIDER_SITE_OTHER): Payer: Medicare HMO | Admitting: Physician Assistant

## 2023-05-20 VITALS — Ht 68.0 in | Wt 172.0 lb

## 2023-05-20 DIAGNOSIS — H00012 Hordeolum externum right lower eyelid: Secondary | ICD-10-CM

## 2023-05-20 MED ORDER — ERYTHROMYCIN 5 MG/GM OP OINT: 1.0000 | TOPICAL_OINTMENT | Freq: Every day | OPHTHALMIC | 0 refills | Status: DC

## 2023-05-20 NOTE — Progress Notes (Signed)
   I acted as a Neurosurgeon for [employer], PA-C Corky Mull, LPN  Virtual Visit via Video Note   I, [name], PA, connected with  Katherine Sexton  ([id number], 1955-11-12) on 05/20/23 at 11:20 AM EST by a video-enabled telemedicine application and verified that I am speaking with the correct person using two identifiers.  Location: Patient: Home Provider: [facility] office   I discussed the limitations of evaluation and management by telemedicine and the availability of in person appointments. The patient expressed understanding and agreed to proceed.    History of Present Illness: Katherine Sexton is a 67 y.o. who identifies as a female who was assigned female at birth, and is being seen today for stye. Pt c/o stye right bottom eyelid. She did try OTC stye medication but not help much. Denies vision changes, significant discharge from eye, recent contacts with someone with similar symptoms, fever, chills.  Problems:  Patient Active Problem List   Diagnosis Date Noted   B12 deficiency 02/14/2023   Vitamin D deficiency 02/14/2023   OA (osteoarthritis) of hip 02/26/2018   Hyperlipidemia 01/06/2018   ADD (attention deficit disorder) 10/22/2017   Migraines    History of PSVT (paroxysmal supraventricular tachycardia) 03/23/2008    Allergies: No Known Allergies Medications:  Current Outpatient Medications:    amphetamine-dextroamphetamine (ADDERALL) 20 MG tablet, Take 20 mg by mouth 2 (two) times daily. , Disp: , Rfl: 0   erythromycin ophthalmic ointment, Place 1 Application into the right eye at bedtime., Disp: 3.5 g, Rfl: 0   hydroxychloroquine (PLAQUENIL) 200 MG tablet, Take 200-400 mg by mouth daily. Through rheumatology- Take 1 tab on odd numbered days and 2 tabs on even numbered days Orally daily for 90 days, Disp: , Rfl:    naproxen sodium (ANAPROX DS) 550 MG tablet, Take 1 tablet (550 mg total) by mouth 2 (two) times daily with a meal. (Patient taking differently:  Take 550 mg by mouth 2 (two) times daily as needed.), Disp: 30 tablet, Rfl: 2   SUMAtriptan (IMITREX) 100 MG tablet, Take 100 mg by mouth every 2 (two) hours as needed for migraine. May repeat in 2 hours if headache persists or recurs., Disp: , Rfl:   Observations/Objective: Patient is well-developed, well-nourished in no acute distress.  Resting comfortably at home.  Slight erythema to right lower eyelid -no obvious swelling or abnormal EOM Head is normocephalic, atraumatic.  No labored breathing.  Speech is clear and coherent with logical content.  Patient is alert and oriented at baseline.   Assessment and Plan: 1. Hordeolum externum of right lower eyelid Not improving with supportive care  Recommend consistent use of warm compress Will trial erythromycin ointment to help with any early infection Do not feel that systemic antibiotics are warranted at this time however if any worsening swelling of eyelid, fever, chills or eye pain needs to let us know immediately  Follow Up Instructions: I discussed the assessment and treatment plan with the patient. The patient was provided an opportunity to ask questions and all were answered. The patient agreed with the plan and demonstrated an understanding of the instructions.  A copy of instructions were sent to the patient via MyChart unless otherwise noted below.   The patient was advised to call back or seek an in-person evaluation if the symptoms worsen or if the condition fails to improve as anticipated.  [name], [place]

## 2023-06-25 DIAGNOSIS — J069 Acute upper respiratory infection, unspecified: Secondary | ICD-10-CM | POA: Diagnosis not present

## 2023-06-25 DIAGNOSIS — Z20822 Contact with and (suspected) exposure to covid-19: Secondary | ICD-10-CM | POA: Diagnosis not present

## 2023-09-26 ENCOUNTER — Ambulatory Visit (INDEPENDENT_AMBULATORY_CARE_PROVIDER_SITE_OTHER): Payer: Medicare HMO | Admitting: Family Medicine

## 2023-09-26 ENCOUNTER — Encounter: Payer: Self-pay | Admitting: Family Medicine

## 2023-09-26 VITALS — BP 112/76 | HR 72 | Temp 97.3°F | Ht 68.0 in | Wt 170.8 lb

## 2023-09-26 DIAGNOSIS — E538 Deficiency of other specified B group vitamins: Secondary | ICD-10-CM

## 2023-09-26 DIAGNOSIS — E559 Vitamin D deficiency, unspecified: Secondary | ICD-10-CM

## 2023-09-26 DIAGNOSIS — Z0000 Encounter for general adult medical examination without abnormal findings: Secondary | ICD-10-CM

## 2023-09-26 DIAGNOSIS — Z1211 Encounter for screening for malignant neoplasm of colon: Secondary | ICD-10-CM

## 2023-09-26 DIAGNOSIS — Z131 Encounter for screening for diabetes mellitus: Secondary | ICD-10-CM

## 2023-09-26 DIAGNOSIS — Z23 Encounter for immunization: Secondary | ICD-10-CM | POA: Diagnosis not present

## 2023-09-26 DIAGNOSIS — E663 Overweight: Secondary | ICD-10-CM | POA: Diagnosis not present

## 2023-09-26 DIAGNOSIS — E785 Hyperlipidemia, unspecified: Secondary | ICD-10-CM | POA: Diagnosis not present

## 2023-09-26 NOTE — Progress Notes (Signed)
 Phone [phone]   Subjective:  Patient presents today for their annual physical. Chief complaint-noted.   See problem oriented charting- ROS- full  review of systems was completed and negative Per full ROS sheet completed by patient other than stress from life situation with grandkids and son in law  The following were reviewed and entered/updated in epic: Past Medical History:  Diagnosis Date   Anxiety    Arthritis 01/2023   Chicken pox    childhood    Hip bursitis    hip bursitis   Migraines    Dr. Neale Burly of headache clinic. Started with Dr. Meryl Crutch years ago. Sumatriptan about 2x a week when stressed.    PSVT (paroxysmal supraventricular tachycardia) (HCC)    1993. provoked by atypical diet. no issues since then. dehydrated, wasnt caring for self   Urinary tract infection    Patient Active Problem List   Diagnosis Date Noted   B12 deficiency 02/14/2023    Priority: Medium    Vitamin D deficiency 02/14/2023    Priority: Medium    Hyperlipidemia 01/06/2018    Priority: Medium    ADD (attention deficit disorder) 10/22/2017    Priority: Medium    Migraines     Priority: Medium    History of PSVT (paroxysmal supraventricular tachycardia) 03/23/2008    Priority: Low   OA (osteoarthritis) of hip 02/26/2018    Priority: 1.   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 not sure what type   COPD Mother        long term smoker   Stroke Mother        11. TIA a few years  prior   Congestive Heart Failure Mother        79   Arthritis Mother    Hypertension Father    Heart disease Father        CABG- mid 16s   Renal cancer Father        early 71s   Pulmonary fibrosis Father        lead to his death   Colon cancer Sister    Breast cancer Daughter    Healthy Son    Lung cancer Maternal Grandmother    Heart disease Paternal Grandmother    Heart attack Paternal Grandfather        died in 65s   Diabetes Neg Hx    Esophageal cancer Neg Hx    Gallbladder disease Neg Hx     Medications- reviewed and updated Current Outpatient Medications  Medication Sig Dispense Refill   amphetamine-dextroamphetamine (ADDERALL) 20 MG tablet Take 20 mg by mouth 2 (two) times daily.   0   hydroxychloroquine (PLAQUENIL) 200 MG tablet Take 200-400 mg by mouth daily. Through rheumatology- Take 1 tab on odd numbered days and 2 tabs on even numbered days Orally daily for 90 days     naproxen sodium (ANAPROX DS) 550 MG tablet  Take 1 tablet (550 mg total) by mouth 2 (two) times daily with a meal. (Patient taking differently: Take 550 mg by mouth 2 (two) times daily as needed.) 30 tablet 2   SUMAtriptan (IMITREX) 100 MG tablet Take 100 mg by mouth every 2 (two) hours as needed for migraine. May repeat in 2 hours if headache persists or recurs.     No current facility-administered medications for this visit.    Allergies-reviewed and updated No Known Allergies  Social History   Social History Narrative   Married. Lives with her husband. Husband John patient of Dr. Durene Cal.    Daughter Waynetta Sandy passed in 2022- son in [employer]- caring for 2 grandchildren - Lindie Spruce and [place]   Her son lives in [place], [place]. Chrissie Noa and Gerilyn Pilgrim  grandsons      Homemaker   BS in nutrition [employer]      Hobbies: painting, time with kids and grandkids, reading, shopping, talking walks   Still enjoying the farm   Objective  Objective:  BP 112/76   Pulse 72   Temp (!) 97.3 F (36.3 C)   Ht 5\' 8"  (1.727  m)   Wt 170 lb 12.8 oz (77.5 kg)   SpO2 98%   BMI 25.97 kg/m  Gen: NAD, resting comfortably HEENT: Mucous membranes are moist. Oropharynx normal Neck: no thyromegaly CV: RRR no murmurs rubs or gallops Lungs: CTAB no crackles, wheeze, rhonchi Abdomen: soft/nontender/nondistended/normal bowel sounds. No rebound or guarding.  Ext: no edema Skin: warm, dry Neuro: grossly normal, moves all extremities, PERRLA   Assessment and Plan   68 y.o. female presenting for annual physical.  Health Maintenance counseling: 1. Anticipatory guidance: Patient counseled regarding regular dental exams -q6 months, eye exams -yearly Dr. Earlene Plater,  avoiding smoking and second hand smoke , limiting alcohol to 1 beverage per day- 3-4 a week , no illicit drugs .   2. Risk factor reduction:  Advised patient of need for regular exercise and diet rich and fruits and vegetables to reduce risk of heart attack and stroke.  Exercise- her exercise has fallen off and that has been a good stress reliever in past. Trying to get back on this as helpful for her.  Diet/weight management-weight stable from last visit but some stress eating even unhealthy foods.  We have recommended trying to target 155-160, high school weight was 135.  Wt Readings from Last 3 Encounters:  09/26/23 170 lb 12.8 oz (77.5 kg)  05/20/23 172 lb (78 kg)  03/11/23 170 lb 6.4 oz (77.3 kg)  3. Immunizations/screenings/ancillary studies-recommended updating Tdap at pharmacy, she declines COVID and flu shot, opts in Prevnar 20  Immunization History  Administered Date(s) Administered   PFIZER(Purple Top)SARS-COV-2 Vaccination 10/22/2019, 11/16/2019, 06/15/2020   Td 03/23/2008   4. Cervical cancer screening- followes with gynecology NP but past formal age-based screening recommendations 5. Breast cancer screening-  breast exam with gynecology and mammogram  needs to get scheduled 6. Colon cancer screening -we have tried for years to get her in for  colonoscopy-with family history- trial again  7. Skin cancer screening-sees [facility].  aDvised regular sunscreen use. Denies worrisome, changing, or new skin lesions.  8. Birth control/STD check- postmenopausal monogamous 9. Osteoporosis screening at 65-see discussion below 10. Smoking associated screening -never smoker  Status of chronic or acute concerns    #social update- son in law dating (daughter passed in 2022) but has invested a lot of time in dating and has been tough on her grandsons who  they care for. Girlfriend moved in and their grades are slipping - she feels getting to the gym would be helpful- wants to get out of house to do this -working with a therapist and good friend support  # Rheumatoid arthritis-presented mid 2024 with joint pain/swelling with positive ANA 01/23/23-later started on hydroxychloroquine by [facility] rheumatology Dr. [name] Freeze -Methotrexate has been consideration- has not needed yet -much improved- continue current medications   -not needing naproxen much anymore  # Osteopenia-bone density March 20, 2023 with worst T-score at left femoral neck -1.7 - encouraged weight bearing exercise- plus take calcium and vitamin D   # Adult ADD-Adderall managed by psychiatry Dr. Evelene Croon   # Migraines- sees Dr. Abelina Bachelor 100 mg as needed- worse with recent stressors   #hyperlipidemia S: Medication:none  Lab Results  Component Value Date   CHOL 302 (H) 11/06/2017   HDL 69.80 11/06/2017   LDLDIRECT 187.0 11/06/2017   TRIG 291.0 (H) 11/06/2017   CHOLHDL 4 11/06/2017   A/P: very elevated levels in past- update today   # B12 deficiency S: Current treatment/medication (oral vs. IM): on oral after prior injections  A/P: will come back for labs   #Vitamin D deficiency S: Medication: 2 chewys  A/P: want at least 1000 units a day and sounds like on- check vitamin D   Recommended follow up: Return in about 1 year (around 09/25/2024) for physical or  sooner if needed.Schedule b4 you leave.  Lab/Order associations:NOT fasting   ICD-10-CM   1. Preventative health care  Z00.00     2. Hyperlipidemia, unspecified hyperlipidemia type  E78.5 Comprehensive metabolic panel    CBC with Differential/Platelet    Lipid panel    3. B12 deficiency  E53.8 Vitamin B12    4. Vitamin D deficiency  E55.9 VITAMIN D 25 Hydroxy (Vit-D Deficiency, Fractures)    5. Screening for diabetes mellitus  Z13.1 Hemoglobin A1c    6. Overweight  E66.3 Hemoglobin A1c    7. Screen for colon cancer  Z12.11 Ambulatory referral to Gastroenterology      No orders of the defined types were placed in this encounter.   Return precautions advised.  Tana Conch, MD

## 2023-09-26 NOTE — Addendum Note (Signed)
 Addended by: Samara Deist on: 09/26/2023 10:48 AM   Modules accepted: Orders

## 2023-09-26 NOTE — Patient Instructions (Signed)
 You are eligible to schedule your annual wellness visit with our nurse specialist Inetta Fermo.  Please consider scheduling this before you leave today  Get mammogram scheduled with [facility] and have them send [place] a copy  Prevnar 20 today- they will cover this in office  Tetanus, Diphtheria, and Pertussis (Tdap) at pharmacy- not covered in office  [facility] contact Please call to schedule visit and/or procedure Address: [address] [place], [place], [place] [address] Phone: [phone]   Schedule a lab visit at the check out desk within 2 weeks. Return for future fasting labs meaning nothing but water after midnight please. Ok to take your medications with water.   Recommended follow up: Return in about 1 year (around 09/25/2024) for physical or sooner if needed.Schedule b4 you leave.

## 2023-11-07 DIAGNOSIS — M2540 Effusion, unspecified joint: Secondary | ICD-10-CM | POA: Diagnosis not present

## 2023-11-07 DIAGNOSIS — E538 Deficiency of other specified B group vitamins: Secondary | ICD-10-CM | POA: Diagnosis not present

## 2023-11-07 DIAGNOSIS — R5383 Other fatigue: Secondary | ICD-10-CM | POA: Diagnosis not present

## 2023-11-07 DIAGNOSIS — Z111 Encounter for screening for respiratory tuberculosis: Secondary | ICD-10-CM | POA: Diagnosis not present

## 2023-11-07 DIAGNOSIS — M79641 Pain in right hand: Secondary | ICD-10-CM | POA: Diagnosis not present

## 2023-11-07 DIAGNOSIS — E663 Overweight: Secondary | ICD-10-CM | POA: Diagnosis not present

## 2023-11-07 DIAGNOSIS — M0579 Rheumatoid arthritis with rheumatoid factor of multiple sites without organ or systems involvement: Secondary | ICD-10-CM | POA: Diagnosis not present

## 2023-11-07 DIAGNOSIS — Z6826 Body mass index (BMI) 26.0-26.9, adult: Secondary | ICD-10-CM | POA: Diagnosis not present

## 2023-11-07 DIAGNOSIS — M79642 Pain in left hand: Secondary | ICD-10-CM | POA: Diagnosis not present

## 2023-11-07 DIAGNOSIS — M2560 Stiffness of unspecified joint, not elsewhere classified: Secondary | ICD-10-CM | POA: Diagnosis not present

## 2023-11-13 DIAGNOSIS — G43019 Migraine without aura, intractable, without status migrainosus: Secondary | ICD-10-CM | POA: Diagnosis not present

## 2023-12-12 DIAGNOSIS — M0579 Rheumatoid arthritis with rheumatoid factor of multiple sites without organ or systems involvement: Secondary | ICD-10-CM | POA: Diagnosis not present

## 2023-12-12 DIAGNOSIS — M2560 Stiffness of unspecified joint, not elsewhere classified: Secondary | ICD-10-CM | POA: Diagnosis not present

## 2023-12-12 DIAGNOSIS — R5383 Other fatigue: Secondary | ICD-10-CM | POA: Diagnosis not present

## 2023-12-12 DIAGNOSIS — Z6826 Body mass index (BMI) 26.0-26.9, adult: Secondary | ICD-10-CM | POA: Diagnosis not present

## 2023-12-12 DIAGNOSIS — M79641 Pain in right hand: Secondary | ICD-10-CM | POA: Diagnosis not present

## 2023-12-12 DIAGNOSIS — E663 Overweight: Secondary | ICD-10-CM | POA: Diagnosis not present

## 2023-12-12 DIAGNOSIS — M2540 Effusion, unspecified joint: Secondary | ICD-10-CM | POA: Diagnosis not present

## 2023-12-12 DIAGNOSIS — M79642 Pain in left hand: Secondary | ICD-10-CM | POA: Diagnosis not present

## 2023-12-16 ENCOUNTER — Encounter: Payer: Self-pay | Admitting: Family Medicine

## 2024-03-31 DIAGNOSIS — Z1231 Encounter for screening mammogram for malignant neoplasm of breast: Secondary | ICD-10-CM | POA: Diagnosis not present

## 2024-03-31 LAB — HM MAMMOGRAPHY

## 2024-04-06 ENCOUNTER — Encounter: Payer: Self-pay | Admitting: Family Medicine

## 2024-04-10 DIAGNOSIS — N6311 Unspecified lump in the right breast, upper outer quadrant: Secondary | ICD-10-CM | POA: Diagnosis not present

## 2024-04-10 DIAGNOSIS — R921 Mammographic calcification found on diagnostic imaging of breast: Secondary | ICD-10-CM | POA: Diagnosis not present

## 2024-04-20 DIAGNOSIS — R928 Other abnormal and inconclusive findings on diagnostic imaging of breast: Secondary | ICD-10-CM | POA: Diagnosis not present

## 2024-07-28 ENCOUNTER — Telehealth

## 2024-07-28 DIAGNOSIS — B9689 Other specified bacterial agents as the cause of diseases classified elsewhere: Secondary | ICD-10-CM | POA: Diagnosis not present

## 2024-07-28 DIAGNOSIS — J019 Acute sinusitis, unspecified: Secondary | ICD-10-CM | POA: Diagnosis not present

## 2024-07-28 MED ORDER — FLUTICASONE PROPIONATE 50 MCG/ACT NA SUSP: 2.0000 | Freq: Every day | NASAL | 0 refills | Status: AC

## 2024-07-28 MED ORDER — AMOXICILLIN-POT CLAVULANATE 875-125 MG PO TABS: 1.0000 | ORAL_TABLET | Freq: Two times a day (BID) | ORAL | 0 refills | Status: AC

## 2024-07-28 NOTE — Progress Notes (Signed)
 " Virtual Visit Consent   Katherine Sexton, you are scheduled for a virtual visit with a [facility] provider today. Just as with appointments in the office, your consent must be obtained to participate. Your consent will be active for this visit and any virtual visit you may have with one of our providers in the next 365 days. If you have a MyChart account, a copy of this consent can be sent to you electronically.  As this is a virtual visit, video technology does not allow for your provider to perform a traditional examination. This may limit your provider's ability to fully assess your condition. If your provider identifies any concerns that need to be evaluated in person or the need to arrange testing (such as labs, EKG, etc.), we will make arrangements to do so. Although advances in technology are sophisticated, we cannot ensure that it will always work on either your end or our end. If the connection with a video visit is poor, the visit may have to be switched to a telephone visit. With either a video or telephone visit, we are not always able to ensure that we have a secure connection.  By engaging in this virtual visit, you consent to the provision of healthcare and authorize for your insurance to be billed (if applicable) for the services provided during this visit. Depending on your insurance coverage, you may receive a charge related to this service.  I need to obtain your verbal consent now. Are you willing to proceed with your visit today? Katherine Sexton has provided verbal consent on 07/28/2024 for a virtual visit (video or telephone). Delon CHRISTELLA Dickinson, PA-C  Date: 07/28/2024 9:09 AM   Virtual Visit via Video Note   I, Delon CHRISTELLA Dickinson, connected with  Katherine Sexton  ([id number], 1955-12-29) on 07/28/2024 at  9:00 AM EST by a video-enabled telemedicine application and verified that I am speaking with the correct person using two identifiers.  Location: Patient: Virtual Visit Location Patient:  Home Provider: Virtual Visit Location Provider: Home Office   I discussed the limitations of evaluation and management by telemedicine and the availability of in person appointments. The patient expressed understanding and agreed to proceed.    History of Present Illness: Katherine Sexton is a 69 y.o. who identifies as a female who was assigned female at birth, and is being seen today for dizziness with sinus congestion.  HPI: Dizziness This is a new problem. The current episode started in the past 7 days. The problem occurs constantly. Progression since onset: Dizziness resolved, sinus pain and congestion still present and unchanged. Associated symptoms include congestion, coughing (mild, post nasal drainage), fatigue, headaches, nausea (yesterday with dizziness, now resolved), a sore throat and vertigo. Pertinent negatives include no chills, fever, myalgias, visual change, vomiting or weakness. Associated symptoms comments: Ears popping, sinus pressure. Nothing aggravates the symptoms. She has tried acetaminophen , lying down, rest and sleep for the symptoms. The treatment provided mild relief.     Problems:  Patient Active Problem List   Diagnosis Date Noted   B12 deficiency 02/14/2023   Vitamin D deficiency 02/14/2023   OA (osteoarthritis) of hip 02/26/2018   Hyperlipidemia 01/06/2018   ADD (attention deficit disorder) 10/22/2017   Migraines    History of PSVT (paroxysmal supraventricular tachycardia) 03/23/2008    Allergies: Allergies[1] Medications: Current Medications[2]  Observations/Objective: Patient is well-developed, well-nourished in no acute distress.  Resting comfortably at home.  Head is normocephalic, atraumatic.  No labored breathing.  Speech is clear and  coherent with logical content.  Patient is alert and oriented at baseline.    Assessment and Plan: 1. Acute bacterial sinusitis (Primary) - fluticasone  (FLONASE ) 50 MCG/ACT nasal spray; Place 2 sprays into both  nostrils daily.  Dispense: 16 g; Refill: 0 - amoxicillin -clavulanate (AUGMENTIN ) 875-125 MG tablet; Take 1 tablet by mouth 2 (two) times daily.  Dispense: 14 tablet; Refill: 0  - Worsening symptoms that have not responded to OTC medications.  - Will give Augmentin  and Flonase  - Continue allergy medications.  - Steam and humidifier can help - Stay well hydrated and get plenty of rest.  - Seek in person evaluation if no symptom improvement or if symptoms worsen   Follow Up Instructions: I discussed the assessment and treatment plan with the patient. The patient was provided an opportunity to ask questions and all were answered. The patient agreed with the plan and demonstrated an understanding of the instructions.  A copy of instructions were sent to the patient via MyChart unless otherwise noted below.    The patient was advised to call back or seek an in-person evaluation if the symptoms worsen or if the condition fails to improve as anticipated.    Delon HERO Alvaro Aungst, PA-C     [1] No Known Allergies [2]  Current Outpatient Medications:    amoxicillin -clavulanate (AUGMENTIN ) 875-125 MG tablet, Take 1 tablet by mouth 2 (two) times daily., Disp: 14 tablet, Rfl: 0   fluticasone  (FLONASE ) 50 MCG/ACT nasal spray, Place 2 sprays into both nostrils daily., Disp: 16 g, Rfl: 0   amphetamine -dextroamphetamine  (ADDERALL) 20 MG tablet, Take 20 mg by mouth 2 (two) times daily. , Disp: , Rfl: 0   hydroxychloroquine (PLAQUENIL) 200 MG tablet, Take 200-400 mg by mouth daily. Through rheumatology- Take 1 tab on odd numbered days and 2 tabs on even numbered days Orally daily for 90 days, Disp: , Rfl:    naproxen  sodium (ANAPROX  DS) 550 MG tablet, Take 1 tablet (550 mg total) by mouth 2 (two) times daily with a meal. (Patient taking differently: Take 550 mg by mouth 2 (two) times daily as needed.), Disp: 30 tablet, Rfl: 2   SUMAtriptan  (IMITREX ) 100 MG tablet, Take 100 mg by mouth every 2 (two) hours as  needed for migraine. May repeat in 2 hours if headache persists or recurs., Disp: , Rfl:   "

## 2024-07-28 NOTE — Patient Instructions (Signed)
 " Katherine Sexton, thank you for joining Delon CHRISTELLA Dickinson, PA-C for today's virtual visit.  While this provider is not your primary care provider (PCP), if your PCP is located in our provider database this encounter information will be shared with them immediately following your visit.   A [facility] MyChart account gives you access to today's visit and all your visits, tests, and labs performed at [facility]  click here if you don't have a [facility] MyChart account or go to [email or web address]  Consent: (Patient) Katherine Sexton provided verbal consent for this virtual visit at the beginning of the encounter.  Current Medications:  Current Outpatient Medications:    amoxicillin -clavulanate (AUGMENTIN ) 875-125 MG tablet, Take 1 tablet by mouth 2 (two) times daily., Disp: 14 tablet, Rfl: 0   fluticasone  (FLONASE ) 50 MCG/ACT nasal spray, Place 2 sprays into both nostrils daily., Disp: 16 g, Rfl: 0   amphetamine -dextroamphetamine  (ADDERALL) 20 MG tablet, Take 20 mg by mouth 2 (two) times daily. , Disp: , Rfl: 0   hydroxychloroquine (PLAQUENIL) 200 MG tablet, Take 200-400 mg by mouth daily. Through rheumatology- Take 1 tab on odd numbered days and 2 tabs on even numbered days Orally daily for 90 days, Disp: , Rfl:    naproxen  sodium (ANAPROX  DS) 550 MG tablet, Take 1 tablet (550 mg total) by mouth 2 (two) times daily with a meal. (Patient taking differently: Take 550 mg by mouth 2 (two) times daily as needed.), Disp: 30 tablet, Rfl: 2   SUMAtriptan  (IMITREX ) 100 MG tablet, Take 100 mg by mouth every 2 (two) hours as needed for migraine. May repeat in 2 hours if headache persists or recurs., Disp: , Rfl:    Medications ordered in this encounter:  Meds ordered this encounter  Medications   fluticasone  (FLONASE ) 50 MCG/ACT nasal spray    Sig: Place 2 sprays into both nostrils daily.    Dispense:  16 g    Refill:  0    Supervising Provider:   BLAISE ALEENE KIDD [[id number]]    amoxicillin -clavulanate (AUGMENTIN ) 875-125 MG tablet    Sig: Take 1 tablet by mouth 2 (two) times daily.    Dispense:  14 tablet    Refill:  0    Supervising Provider:   BLAISE ALEENE KIDD [[id number]]     *If you need refills on other medications prior to your next appointment, please contact your pharmacy*  Follow-Up: Call back or seek an in-person evaluation if the symptoms worsen or if the condition fails to improve as anticipated.  [facility] [phone]  Other Instructions  Sinus Infection, Adult A sinus infection, also called sinusitis, is inflammation of your sinuses. Sinuses are hollow spaces in the bones around your face. Your sinuses are located: Around your eyes. In the middle of your forehead. Behind your nose. In your cheekbones. Mucus normally drains out of your sinuses. When your nasal tissues become inflamed or swollen, mucus can become trapped or blocked. This allows bacteria, viruses, and fungi to grow, which leads to infection. Most infections of the sinuses are caused by a virus. A sinus infection can develop quickly. It can last for up to 4 weeks (acute) or for more than 12 weeks (chronic). A sinus infection often develops after a cold. What are the causes? This condition is caused by anything that creates swelling in the sinuses or stops mucus from draining. This includes: Allergies. Asthma. Infection from bacteria or viruses. Deformities or blockages in your nose or sinuses. Abnormal  growths in the nose (nasal polyps). Pollutants, such as chemicals or irritants in the air. Infection from fungi. This is rare. What increases the risk? You are more likely to develop this condition if you: Have a weak body defense system (immune system). Do a lot of swimming or diving. Overuse nasal sprays. Smoke. What are the signs or symptoms? The main symptoms of this condition are pain and a feeling of pressure around the affected sinuses. Other symptoms  include: Stuffy nose or congestion that makes it difficult to breathe through your nose. Thick yellow or greenish drainage from your nose. Tenderness, swelling, and warmth over the affected sinuses. A cough that may get worse at night. Decreased sense of smell and taste. Extra mucus that collects in the throat or the back of the nose (postnasal drip) causing a sore throat or bad breath. Tiredness (fatigue). Fever. How is this diagnosed? This condition is diagnosed based on: Your symptoms. Your medical history. A physical exam. Tests to find out if your condition is acute or chronic. This may include: Checking your nose for nasal polyps. Viewing your sinuses using a device that has a light (endoscope). Testing for allergies or bacteria. Imaging tests, such as an MRI or CT scan. In rare cases, a bone biopsy may be done to rule out more serious types of fungal sinus disease. How is this treated? Treatment for a sinus infection depends on the cause and whether your condition is chronic or acute. If caused by a virus, your symptoms should go away on their own within 10 days. You may be given medicines to relieve symptoms. They include: Medicines that shrink swollen nasal passages (decongestants). A spray that eases inflammation of the nostrils (topical intranasal corticosteroids). Rinses that help get rid of thick mucus in your nose (nasal saline washes). Medicines that treat allergies (antihistamines). Over-the-counter pain relievers. If caused by bacteria, your health care provider may recommend waiting to see if your symptoms improve. Most bacterial infections will get better without antibiotic medicine. You may be given antibiotics if you have: A severe infection. A weak immune system. If caused by narrow nasal passages or nasal polyps, surgery may be needed. Follow these instructions at home: Medicines Take, use, or apply over-the-counter and prescription medicines only as told by  your health care provider. These may include nasal sprays. If you were prescribed an antibiotic medicine, take it as told by your health care provider. Do not stop taking the antibiotic even if you start to feel better. Hydrate and humidify  Drink enough fluid to keep your urine pale yellow. Staying hydrated will help to thin your mucus. Use a cool mist humidifier to keep the humidity level in your home above 50%. Inhale steam for 10-15 minutes, 3-4 times a day, or as told by your health care provider. You can do this in the bathroom while a hot shower is running. Limit your exposure to cool or dry air. Rest Rest as much as possible. Sleep with your head raised (elevated). Make sure you get enough sleep each night. General instructions  Apply a warm, moist washcloth to your face 3-4 times a day or as told by your health care provider. This will help with discomfort. Use nasal saline washes as often as told by your health care provider. Wash your hands often with soap and water  to reduce your exposure to germs. If soap and water  are not available, use hand sanitizer. Do not smoke. Avoid being around people who are smoking (secondhand smoke).  Keep all follow-up visits. This is important. Contact a health care provider if: You have a fever. Your symptoms get worse. Your symptoms do not improve within 10 days. Get help right away if: You have a severe headache. You have persistent vomiting. You have severe pain or swelling around your face or eyes. You have vision problems. You develop confusion. Your neck is stiff. You have trouble breathing. These symptoms may be an emergency. Get help right away. Call 911. Do not wait to see if the symptoms will go away. Do not drive yourself to the hospital. Summary A sinus infection is soreness and inflammation of your sinuses. Sinuses are hollow spaces in the bones around your face. This condition is caused by nasal tissues that become inflamed  or swollen. The swelling traps or blocks the flow of mucus. This allows bacteria, viruses, and fungi to grow, which leads to infection. If you were prescribed an antibiotic medicine, take it as told by your health care provider. Do not stop taking the antibiotic even if you start to feel better. Keep all follow-up visits. This is important. This information is not intended to replace advice given to you by your health care provider. Make sure you discuss any questions you have with your health care provider. Document Revised: 06/06/2021 Document Reviewed: 06/06/2021 Elsevier Patient Education  2024 [employer]   If you have been instructed to have an in-person evaluation today at a local Urgent Care facility, please use the link below. It will take you to a list of all of our available [facility], including address, phone number and hours of operation. Please do not delay care.  [facility]  If you or a family member do not have a primary care provider, use the link below to schedule a visit and establish care. When you choose a [facility] primary care physician or advanced practice provider, you gain a long-term partner in health. Find a Primary Care Provider  Learn more about [facility]'s in-office and virtual care options: Reading - Get Care Now "

## 2024-08-04 NOTE — Progress Notes (Signed)
 Tereasa Yilmaz                                          MRN: [id number]   08/04/2024   The [facility] Quality Team Specialist reviewed this patient medical record for the purposes of chart review for care gap closure. The following were reviewed: chart review for care gap closure-colorectal cancer screening.    [facility]I Quality Team

## 2024-08-17 ENCOUNTER — Encounter: Payer: Self-pay | Admitting: Family Medicine
